# Patient Record
Sex: Male | Born: 1955 | Race: White | Hispanic: No | Marital: Married | Smoking: Never smoker
Health system: Southern US, Community
[De-identification: ages and names within clinical notes are randomized; demographics above are authoritative.]

## PROBLEM LIST (undated history)

## (undated) DIAGNOSIS — J309 Allergic rhinitis, unspecified: Secondary | ICD-10-CM

## (undated) DIAGNOSIS — E7800 Pure hypercholesterolemia, unspecified: Secondary | ICD-10-CM

## (undated) DIAGNOSIS — I10 Essential (primary) hypertension: Secondary | ICD-10-CM

## (undated) DIAGNOSIS — D696 Thrombocytopenia, unspecified: Secondary | ICD-10-CM

## (undated) HISTORY — DX: Allergic rhinitis, unspecified: J30.9

## (undated) HISTORY — DX: Pure hypercholesterolemia, unspecified: E78.00

## (undated) HISTORY — DX: Essential (primary) hypertension: I10

## (undated) HISTORY — DX: Thrombocytopenia, unspecified: D69.6

## (undated) HISTORY — PX: OTHER SURGICAL HISTORY: SHX169

---
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
## 2005-04-10 ENCOUNTER — Ambulatory Visit (HOSPITAL_COMMUNITY): Admission: RE | Admit: 2005-04-10 | Discharge: 2005-04-10 | Payer: Self-pay | Admitting: Gastroenterology

## 2009-08-03 ENCOUNTER — Ambulatory Visit: Payer: Self-pay | Admitting: Oncology

## 2009-08-12 LAB — CBC WITH DIFFERENTIAL/PLATELET
BASO%: 0.3 % (ref 0.0–2.0)
Basophils Absolute: 0 10*3/uL (ref 0.0–0.1)
EOS%: 3.5 % (ref 0.0–7.0)
Eosinophils Absolute: 0.2 10*3/uL (ref 0.0–0.5)
HCT: 43.8 % (ref 38.4–49.9)
HGB: 14.8 g/dL (ref 13.0–17.1)
LYMPH%: 31.2 % (ref 14.0–49.0)
MCH: 31.3 pg (ref 27.2–33.4)
MCHC: 33.9 g/dL (ref 32.0–36.0)
MCV: 92.3 fL (ref 79.3–98.0)
MONO#: 0.4 10*3/uL (ref 0.1–0.9)
MONO%: 7.3 % (ref 0.0–14.0)
NEUT#: 3.1 10*3/uL (ref 1.5–6.5)
NEUT%: 57.7 % (ref 39.0–75.0)
Platelets: 138 10*3/uL — ABNORMAL LOW (ref 140–400)
RBC: 4.74 10*6/uL (ref 4.20–5.82)
RDW: 13.6 % (ref 11.0–14.6)
WBC: 5.4 10*3/uL (ref 4.0–10.3)
lymph#: 1.7 10*3/uL (ref 0.9–3.3)

## 2009-08-12 LAB — COMPREHENSIVE METABOLIC PANEL
ALT: 26 U/L (ref 0–53)
AST: 24 U/L (ref 0–37)
Albumin: 4.1 g/dL (ref 3.5–5.2)
Alkaline Phosphatase: 47 U/L (ref 39–117)
BUN: 18 mg/dL (ref 6–23)
CO2: 31 mEq/L (ref 19–32)
Calcium: 9.3 mg/dL (ref 8.4–10.5)
Chloride: 101 mEq/L (ref 96–112)
Creatinine, Ser: 1.25 mg/dL (ref 0.40–1.50)
Glucose, Bld: 105 mg/dL — ABNORMAL HIGH (ref 70–99)
Potassium: 3.9 mEq/L (ref 3.5–5.3)
Sodium: 139 mEq/L (ref 135–145)
Total Bilirubin: 0.9 mg/dL (ref 0.3–1.2)
Total Protein: 8 g/dL (ref 6.0–8.3)

## 2009-08-12 LAB — CHCC SMEAR

## 2009-08-12 LAB — MORPHOLOGY: PLT EST: DECREASED

## 2009-08-14 LAB — ANA: Anti Nuclear Antibody(ANA): NEGATIVE

## 2009-08-16 LAB — PROTEIN ELECTROPHORESIS, SERUM
Albumin ELP: 57.8 % (ref 55.8–66.1)
Alpha-1-Globulin: 3.6 % (ref 2.9–4.9)
Alpha-2-Globulin: 7.5 % (ref 7.1–11.8)
Beta 2: 5.3 % (ref 3.2–6.5)
Beta Globulin: 6.1 % (ref 4.7–7.2)
Gamma Globulin: 19.7 % — ABNORMAL HIGH (ref 11.1–18.8)
Total Protein, Serum Electrophoresis: 7.4 g/dL (ref 6.0–8.3)

## 2009-08-16 LAB — FOLATE: Folate: >20 ng/mL

## 2009-08-16 LAB — VITAMIN B12: Vitamin B-12: 1073 pg/mL — ABNORMAL HIGH (ref 211–911)

## 2009-08-16 LAB — TSH: TSH: 1.325 u[IU]/mL (ref 0.350–4.500)

## 2009-11-11 ENCOUNTER — Ambulatory Visit: Payer: Self-pay | Admitting: Oncology

## 2009-11-15 LAB — CBC WITH DIFFERENTIAL/PLATELET
BASO%: 0.4 % (ref 0.0–2.0)
Basophils Absolute: 0 10*3/uL (ref 0.0–0.1)
EOS%: 3.9 % (ref 0.0–7.0)
Eosinophils Absolute: 0.3 10*3/uL (ref 0.0–0.5)
HCT: 42.5 % (ref 38.4–49.9)
HGB: 14.6 g/dL (ref 13.0–17.1)
LYMPH%: 25.4 % (ref 14.0–49.0)
MCH: 31.4 pg (ref 27.2–33.4)
MCHC: 34.5 g/dL (ref 32.0–36.0)
MCV: 91.2 fL (ref 79.3–98.0)
MONO#: 0.5 10*3/uL (ref 0.1–0.9)
MONO%: 7.2 % (ref 0.0–14.0)
NEUT#: 4.1 10*3/uL (ref 1.5–6.5)
NEUT%: 63.1 % (ref 39.0–75.0)
Platelets: 158 10*3/uL (ref 140–400)
RBC: 4.66 10*6/uL (ref 4.20–5.82)
RDW: 13.7 % (ref 11.0–14.6)
WBC: 6.4 10*3/uL (ref 4.0–10.3)
lymph#: 1.6 10*3/uL (ref 0.9–3.3)

## 2009-11-15 LAB — MORPHOLOGY: PLT EST: ADEQUATE

## 2009-11-15 LAB — CHCC SMEAR

## 2010-02-11 ENCOUNTER — Ambulatory Visit: Payer: Self-pay | Admitting: Oncology

## 2010-02-14 LAB — CBC WITH DIFFERENTIAL/PLATELET
BASO%: 0.4 % (ref 0.0–2.0)
Basophils Absolute: 0 10*3/uL (ref 0.0–0.1)
EOS%: 3.6 % (ref 0.0–7.0)
Eosinophils Absolute: 0.2 10*3/uL (ref 0.0–0.5)
HCT: 41.5 % (ref 38.4–49.9)
HGB: 14.7 g/dL (ref 13.0–17.1)
LYMPH%: 34.5 % (ref 14.0–49.0)
MCH: 31.7 pg (ref 27.2–33.4)
MCHC: 35.4 g/dL (ref 32.0–36.0)
MCV: 89.6 fL (ref 79.3–98.0)
MONO#: 0.4 10*3/uL (ref 0.1–0.9)
MONO%: 8.1 % (ref 0.0–14.0)
NEUT#: 2.6 10*3/uL (ref 1.5–6.5)
NEUT%: 53.4 % (ref 39.0–75.0)
Platelets: 136 10*3/uL — ABNORMAL LOW (ref 140–400)
RBC: 4.64 10*6/uL (ref 4.20–5.82)
RDW: 13.6 % (ref 11.0–14.6)
WBC: 4.9 10*3/uL (ref 4.0–10.3)
lymph#: 1.7 10*3/uL (ref 0.9–3.3)

## 2010-02-14 LAB — CHCC SMEAR

## 2010-02-14 LAB — COMPREHENSIVE METABOLIC PANEL
ALT: 20 U/L (ref 0–53)
AST: 18 U/L (ref 0–37)
Albumin: 4.4 g/dL (ref 3.5–5.2)
Alkaline Phosphatase: 48 U/L (ref 39–117)
BUN: 18 mg/dL (ref 6–23)
CO2: 25 mEq/L (ref 19–32)
Calcium: 9.1 mg/dL (ref 8.4–10.5)
Chloride: 102 mEq/L (ref 96–112)
Creatinine, Ser: 1.12 mg/dL (ref 0.40–1.50)
Glucose, Bld: 108 mg/dL — ABNORMAL HIGH (ref 70–99)
Potassium: 4.1 mEq/L (ref 3.5–5.3)
Sodium: 138 mEq/L (ref 135–145)
Total Bilirubin: 0.5 mg/dL (ref 0.3–1.2)
Total Protein: 7.5 g/dL (ref 6.0–8.3)

## 2010-02-14 LAB — HEPATITIS C ANTIBODY: HCV Ab: NEGATIVE

## 2010-03-04 ENCOUNTER — Ambulatory Visit (HOSPITAL_COMMUNITY): Admission: RE | Admit: 2010-03-04 | Discharge: 2010-03-04 | Payer: Self-pay | Admitting: Oncology

## 2010-05-12 ENCOUNTER — Ambulatory Visit: Payer: Self-pay | Admitting: Oncology

## 2010-05-16 LAB — CBC WITH DIFFERENTIAL/PLATELET
BASO%: 0.4 % (ref 0.0–2.0)
Basophils Absolute: 0 10*3/uL (ref 0.0–0.1)
EOS%: 3.9 % (ref 0.0–7.0)
Eosinophils Absolute: 0.2 10*3/uL (ref 0.0–0.5)
HCT: 43 % (ref 38.4–49.9)
HGB: 15.1 g/dL (ref 13.0–17.1)
LYMPH%: 29.6 % (ref 14.0–49.0)
MCH: 31.5 pg (ref 27.2–33.4)
MCHC: 35.1 g/dL (ref 32.0–36.0)
MCV: 89.7 fL (ref 79.3–98.0)
MONO#: 0.3 10*3/uL (ref 0.1–0.9)
MONO%: 6.3 % (ref 0.0–14.0)
NEUT#: 3.2 10*3/uL (ref 1.5–6.5)
NEUT%: 59.8 % (ref 39.0–75.0)
Platelets: 141 10*3/uL (ref 140–400)
RBC: 4.8 10*6/uL (ref 4.20–5.82)
RDW: 13.8 % (ref 11.0–14.6)
WBC: 5.4 10*3/uL (ref 4.0–10.3)
lymph#: 1.6 10*3/uL (ref 0.9–3.3)

## 2010-05-17 LAB — HEPATITIS B SURFACE ANTIGEN: Hepatitis B Surface Ag: NEGATIVE

## 2010-05-17 LAB — HEPATITIS B CORE ANTIBODY, IGM: Hep B C IgM: NEGATIVE

## 2010-05-17 LAB — HEPATITIS B CORE ANTIBODY, TOTAL: Hep B Core Total Ab: NEGATIVE

## 2010-05-17 LAB — HEPATITIS B SURFACE ANTIBODY,QUALITATIVE: Hep B S Ab: NEGATIVE

## 2010-08-18 ENCOUNTER — Ambulatory Visit: Payer: Self-pay | Admitting: Oncology

## 2010-08-22 LAB — CBC WITH DIFFERENTIAL/PLATELET
BASO%: 0.5 % (ref 0.0–2.0)
Basophils Absolute: 0 10*3/uL (ref 0.0–0.1)
EOS%: 3.3 % (ref 0.0–7.0)
Eosinophils Absolute: 0.2 10*3/uL (ref 0.0–0.5)
HCT: 41.4 % (ref 38.4–49.9)
HGB: 14.6 g/dL (ref 13.0–17.1)
LYMPH%: 32.9 % (ref 14.0–49.0)
MCH: 31.8 pg (ref 27.2–33.4)
MCHC: 35.3 g/dL (ref 32.0–36.0)
MCV: 90 fL (ref 79.3–98.0)
MONO#: 0.4 10*3/uL (ref 0.1–0.9)
MONO%: 8 % (ref 0.0–14.0)
NEUT#: 2.9 10*3/uL (ref 1.5–6.5)
NEUT%: 55.3 % (ref 39.0–75.0)
Platelets: 144 10*3/uL (ref 140–400)
RBC: 4.6 10*6/uL (ref 4.20–5.82)
RDW: 13.9 % (ref 11.0–14.6)
WBC: 5.2 10*3/uL (ref 4.0–10.3)
lymph#: 1.7 10*3/uL (ref 0.9–3.3)

## 2010-08-22 LAB — COMPREHENSIVE METABOLIC PANEL
ALT: 21 U/L (ref 0–53)
AST: 21 U/L (ref 0–37)
Albumin: 4.5 g/dL (ref 3.5–5.2)
Alkaline Phosphatase: 49 U/L (ref 39–117)
BUN: 19 mg/dL (ref 6–23)
CO2: 29 mEq/L (ref 19–32)
Calcium: 9.3 mg/dL (ref 8.4–10.5)
Chloride: 102 mEq/L (ref 96–112)
Creatinine, Ser: 0.98 mg/dL (ref 0.40–1.50)
Glucose, Bld: 97 mg/dL (ref 70–99)
Potassium: 4 mEq/L (ref 3.5–5.3)
Sodium: 140 mEq/L (ref 135–145)
Total Bilirubin: 0.6 mg/dL (ref 0.3–1.2)
Total Protein: 7.4 g/dL (ref 6.0–8.3)

## 2010-08-22 LAB — LACTATE DEHYDROGENASE: LDH: 126 U/L (ref 94–250)

## 2011-02-17 NOTE — Op Note (Signed)
NAME:  TRISHA, MORANDI                ACCOUNT NO.:  [id number]   MEDICAL RECORD NO.:  [id number]          PATIENT TYPE:  AMB   LOCATION:  ENDO                         FACILITY:  [facility]   PHYSICIAN:  Danise Edge, M.D.   DATE OF BIRTH:  04/26/1956   DATE OF PROCEDURE:  04/10/2005  DATE OF DISCHARGE:                                 OPERATIVE REPORT   PROCEDURE:  Screening colonoscopy.   INDICATIONS:  Mr. Reyaan Thoma is a 55 year old male born [birth year]. Mr.  Yu is scheduled to undergo his first screening colonoscopy with  polypectomy to prevent colon cancer.   ENDOSCOPIST:  Danise Edge, M.D.   PREMEDICATION:  Versed 9 mg, Demerol 90 mg.   DESCRIPTION OF PROCEDURE:  After obtaining informed consent, Mr. Venn was  placed in the left lateral decubitus position. I administered intravenous  Demerol and intravenous Versed to achieve conscious sedation for the  procedure. The patient's blood pressure, oxygen saturation and cardiac  rhythm were monitored throughout the procedure and documented in the medical  record.   Anal inspection and digital rectal exam were normal. The prostate was  nonnodular. The Olympus adjustable pediatric colonoscope was introduced into  the rectum and advanced to the cecum. Mr. Chura has a very long somewhat  tortuous colon. In order to reach the cecum, he was rolled to the supine  position and finally the right lateral decubitus position applying external  abdominal pressure. Colonic preparation for the exam today was excellent.   RECTUM:  Normal. I was unable to perform a retroflexed view of the distal  rectum.  SIGMOID COLON AND DESCENDING COLON:  Normal.  SPLENIC FLEXURE:  Normal.  TRANSVERSE COLON:  Normal.  HEPATIC FLEXURE:  Normal.  ASCENDING COLON:  Normal.  CECUM AND ILEOCECAL VALVE:  Normal.   ASSESSMENT:  Normal screening proctocolonoscopy to the cecum.   RECOMMENDATIONS:  Virtual colonoscopy in five years.       MJ/MEDQ   D:  04/10/2005  T:  04/10/2005  Job:  [id number]

## 2013-10-20 ENCOUNTER — Other Ambulatory Visit: Payer: Self-pay | Admitting: Geriatric Medicine

## 2013-10-20 ENCOUNTER — Ambulatory Visit
Admission: RE | Admit: 2013-10-20 | Discharge: 2013-10-20 | Disposition: A | Discharge status: Home / Self Care | Payer: BC Managed Care – PPO | Source: Ambulatory Visit | Attending: Geriatric Medicine | Admitting: Geriatric Medicine

## 2013-10-20 DIAGNOSIS — R079 Chest pain, unspecified: Secondary | ICD-10-CM

## 2013-11-04 ENCOUNTER — Encounter: Payer: Self-pay | Admitting: Cardiology

## 2013-11-04 DIAGNOSIS — E782 Mixed hyperlipidemia: Secondary | ICD-10-CM | POA: Insufficient documentation

## 2013-11-04 DIAGNOSIS — Z79899 Other long term (current) drug therapy: Secondary | ICD-10-CM

## 2013-11-04 DIAGNOSIS — R079 Chest pain, unspecified: Secondary | ICD-10-CM

## 2013-11-04 DIAGNOSIS — I10 Essential (primary) hypertension: Secondary | ICD-10-CM | POA: Insufficient documentation

## 2013-11-04 DIAGNOSIS — E7800 Pure hypercholesterolemia, unspecified: Secondary | ICD-10-CM | POA: Insufficient documentation

## 2013-11-05 ENCOUNTER — Ambulatory Visit: Payer: Self-pay | Admitting: Interventional Cardiology

## 2013-11-11 ENCOUNTER — Ambulatory Visit (INDEPENDENT_AMBULATORY_CARE_PROVIDER_SITE_OTHER): Payer: BC Managed Care – PPO | Admitting: Nurse Practitioner

## 2013-11-11 ENCOUNTER — Encounter: Payer: Self-pay | Admitting: Interventional Cardiology

## 2013-11-11 ENCOUNTER — Ambulatory Visit (INDEPENDENT_AMBULATORY_CARE_PROVIDER_SITE_OTHER): Payer: BC Managed Care – PPO | Admitting: Interventional Cardiology

## 2013-11-11 VITALS — BP 129/85 | HR 107

## 2013-11-11 VITALS — BP 134/82 | HR 101 | Ht 70.0 in | Wt 210.0 lb

## 2013-11-11 DIAGNOSIS — R079 Chest pain, unspecified: Secondary | ICD-10-CM

## 2013-11-11 NOTE — Progress Notes (Signed)
Patient ID: TZION WEDEL, male   DOB: 11-16-1955, 58 y.o.   MRN: [id number]     Patient ID: ANDREE GOLPHIN MRN: [id number] DOB/AGE: 1956/07/31 58 y.o.   Referring Physician Dr. Felipa Eth   Reason for Consultation chest pain  HPI: 58 y/o with some RF for CAD.  He had some left sided chest pain 6 weeks ago that radiated to his back.  It was not associated with any exertion.  No sweating or nausea.  No dizziness, vomiting or paasing out occurred.  Most strenuous is walking several flights of stairs in a hotel.  He does not do any other regular exercise.   His father had an MI at 83.  The patient has had a stress test several years ago. It was normal per his report.  He has had routine ECGs.  Two sisters without heart problems.     Current Outpatient Prescriptions  Medication Sig Dispense Refill  . aspirin 81 MG tablet Take 81 mg by mouth daily.      Marland Kitchen lisinopril (PRINIVIL,ZESTRIL) 10 MG tablet Take 10 mg by mouth daily.      Marland Kitchen loratadine (CLARITIN) 10 MG tablet Take 10 mg by mouth daily.      Marland Kitchen zolpidem (AMBIEN) 10 MG tablet Take 10 mg by mouth at bedtime as needed for sleep.       No current facility-administered medications for this visit.   Past Medical History  Diagnosis Date  . Hypertension   . Allergic rhinitis   . Hypercholesteremia     goal LDL less than 130- 10 year risk 8.8 on 7/14  . Hiatal hernia   . Thrombocytopenia     130,000 in 2010-Dr. HA-due to simvastatin    Family History  Problem Relation Age of Onset  . Arrhythmia Mother     afib  . Hypertension Mother   . Other Mother     died in [place] with husband  . Alzheimer's disease Father   . Heart disease Father   . Other Father     died in MVA    History   Social History  . Marital Status: Married    Spouse Name: N/A    Number of Children: N/A  . Years of Education: N/A   Occupational History  . Not on file.   Social History Main Topics  . Smoking status: Never Smoker   . Smokeless tobacco: Not  on file  . Alcohol Use: Yes  . Drug Use: No  . Sexual Activity: Not on file   Other Topics Concern  . Not on file   Social History Narrative  . No narrative on file    Past Surgical History  Procedure Laterality Date  . Appendectomy  1975  . Ruptured disc        (Not in a hospital admission)  Review of systems complete and found to be negative unless listed above .  No nausea, vomiting.  No fever chills, No focal weakness,  No palpitations.  Physical Exam: Filed Vitals:   11/11/13 0901  BP: 134/82  Pulse: 101    Weight: 210 lb (95.255 kg)  Physical exam:  [place]/AT EOMI No JVD, No carotid bruit RRR S1S2  No wheezing Soft. NT, nondistended No edema. No focal motor or sensory deficits Normal affect  Labs:   Lab Results  Component Value Date   WBC 5.2 08/22/2010   HGB 14.6 08/22/2010   HCT 41.4 08/22/2010   MCV 90.0 08/22/2010   PLT  144 08/22/2010   No results found for this basename: NA, K, CL, CO2, BUN, CREATININE, CALCIUM, LABALBU, PROT, BILITOT, ALKPHOS, ALT, AST, GLUCOSE,  in the last 168 hours No results found for this basename: CKTOTAL, CKMB, CKMBINDEX, TROPONINI    No results found for this basename: CHOL   No results found for this basename: HDL   No results found for this basename: LDLCALC   No results found for this basename: TRIG   No results found for this basename: CHOLHDL   No results found for this basename: LDLDIRECT      Radiology: Normal CXray in Jan 2015 EKG: Sinus tachycardia, NSST inferiorly  ASSESSMENT AND PLAN:  1. Atypical chest pain: Plan for ETT.  Doubt ischemia based on sx.  Overall, he needs RF modification.  I would recommend that he walk 30 minutes a day 5 days/week.  He does travel a fair amount.  He needs to use the gym at the hotels.  He needs to make good choices when eating out while away.  Cholesterol: 200; TG 117; HDL 33; LDL143.  Hopefully, LDL to < 130 after lifestyle changes.  Signed:   Mina Marble, MD,  [facility] 11/11/2013, 9:28 AM

## 2013-11-11 NOTE — Patient Instructions (Signed)
Your physician has requested that you have an exercise tolerance test. For further information please visit [email or web address]. Please also follow instruction sheet, as given.  Your physician recommends that you schedule a follow-up appointment as needed.   

## 2013-11-11 NOTE — Progress Notes (Signed)
Exercise Treadmill Test  Pre-Exercise Testing Evaluation Rhythm: sinus tachycardia  Rate: 96 bpm     Test  Exercise Tolerance Test Ordering MD: Casandra Doffing, MD  Interpreting MD: Truitt Merle, NP  Unique Test No: 1  Treadmill:  1  Indication for ETT: chest pain - rule out ischemia  Contraindication to ETT: No   Stress Modality: exercise - treadmill  Cardiac Imaging Performed: non   Protocol: standard Bruce - maximal  Max BP:  202/71*  Max MPHR (bpm):  162 85% MPR (bpm):  138  MPHR obtained (bpm):  148 % MPHR obtained:  91%  Reached 85% MPHR (min:sec):  7:35 Total Exercise Time (min-sec):  10 minutes  Workload in METS:  11.7 Borg Scale: 15  Reason ETT Terminated:  patient's desire to stop    ST Segment Analysis At Rest: normal ST segments - no evidence of significant ST depression With Exercise: no evidence of significant ST depression  Other Information Arrhythmia:  No Angina during ETT:  absent (0) Quality of ETT:  diagnostic  ETT Interpretation:  normal - no evidence of ischemia by ST analysis  Comments: Patient presents today for routine GXT. Has had atypical chest pain and has risk factors for CAD.   Today the patient exercised on the standard Bruce protocol for a total of 10 minutes.  Good exercise tolerance.  Adequate blood pressure response.  Clinically negative for chest pain. Test was stopped due to achievement of target HR/fatigue.  EKG negative for ischemia. No significant arrhythmia noted.   Recommendations: CV risk factor modification.  See back prn.  Patient is agreeable to this plan and will call if any problems develop in the interim.   Burtis Junes, RN, [place] [address] [place], [place]  [address] [phone]

## 2013-12-23 ENCOUNTER — Ambulatory Visit (HOSPITAL_COMMUNITY)
Admission: RE | Admit: 2013-12-23 | Discharge: 2013-12-23 | Disposition: A | Discharge status: Home / Self Care | Payer: BC Managed Care – PPO | Source: Ambulatory Visit | Attending: Orthopedic Surgery | Admitting: Orthopedic Surgery

## 2013-12-23 ENCOUNTER — Other Ambulatory Visit (HOSPITAL_COMMUNITY): Payer: Self-pay | Admitting: Orthopedic Surgery

## 2013-12-23 DIAGNOSIS — Z135 Encounter for screening for eye and ear disorders: Secondary | ICD-10-CM | POA: Insufficient documentation

## 2013-12-23 DIAGNOSIS — R52 Pain, unspecified: Secondary | ICD-10-CM

## 2015-07-08 ENCOUNTER — Other Ambulatory Visit: Payer: Self-pay | Admitting: Gastroenterology

## 2015-10-07 ENCOUNTER — Encounter (HOSPITAL_COMMUNITY): Payer: Self-pay | Admitting: *Deleted

## 2015-10-18 ENCOUNTER — Ambulatory Visit (HOSPITAL_COMMUNITY): Payer: BLUE CROSS/BLUE SHIELD | Admitting: Anesthesiology

## 2015-10-18 ENCOUNTER — Encounter (HOSPITAL_COMMUNITY): Payer: Self-pay

## 2015-10-18 ENCOUNTER — Encounter (HOSPITAL_COMMUNITY)
Admission: RE | Disposition: A | Discharge status: Home / Self Care | Payer: Self-pay | Source: Ambulatory Visit | Attending: Gastroenterology

## 2015-10-18 ENCOUNTER — Ambulatory Visit (HOSPITAL_COMMUNITY)
Admission: RE | Admit: 2015-10-18 | Discharge: 2015-10-18 | Disposition: A | Discharge status: Home / Self Care | Payer: BLUE CROSS/BLUE SHIELD | Source: Ambulatory Visit | Attending: Gastroenterology | Admitting: Gastroenterology

## 2015-10-18 DIAGNOSIS — Z6830 Body mass index (BMI) 30.0-30.9, adult: Secondary | ICD-10-CM | POA: Diagnosis not present

## 2015-10-18 DIAGNOSIS — I10 Essential (primary) hypertension: Secondary | ICD-10-CM | POA: Diagnosis not present

## 2015-10-18 DIAGNOSIS — Z1211 Encounter for screening for malignant neoplasm of colon: Secondary | ICD-10-CM | POA: Insufficient documentation

## 2015-10-18 DIAGNOSIS — Z79899 Other long term (current) drug therapy: Secondary | ICD-10-CM | POA: Diagnosis not present

## 2015-10-18 DIAGNOSIS — Z9049 Acquired absence of other specified parts of digestive tract: Secondary | ICD-10-CM | POA: Insufficient documentation

## 2015-10-18 DIAGNOSIS — D120 Benign neoplasm of cecum: Secondary | ICD-10-CM | POA: Diagnosis not present

## 2015-10-18 DIAGNOSIS — E7800 Pure hypercholesterolemia, unspecified: Secondary | ICD-10-CM | POA: Diagnosis not present

## 2015-10-18 DIAGNOSIS — E669 Obesity, unspecified: Secondary | ICD-10-CM | POA: Insufficient documentation

## 2015-10-18 HISTORY — PX: COLONOSCOPY WITH PROPOFOL: SHX5780

## 2015-10-18 SURGERY — COLONOSCOPY WITH PROPOFOL
Anesthesia: Monitor Anesthesia Care

## 2015-10-18 MED ORDER — PROPOFOL 10 MG/ML IV BOLUS
INTRAVENOUS | Status: DC | PRN
  Administered 2015-10-18: 50 mg via INTRAVENOUS
  Administered 2015-10-18 (×2): 20 mg via INTRAVENOUS
  Administered 2015-10-18: 30 mg via INTRAVENOUS
  Administered 2015-10-18 (×2): 20 mg via INTRAVENOUS
  Administered 2015-10-18: 30 mg via INTRAVENOUS
  Administered 2015-10-18: 10 mg via INTRAVENOUS
  Administered 2015-10-18: 20 mg via INTRAVENOUS
  Administered 2015-10-18: 10 mg via INTRAVENOUS
  Administered 2015-10-18 (×2): 30 mg via INTRAVENOUS
  Administered 2015-10-18: 20 mg via INTRAVENOUS
  Administered 2015-10-18: 40 mg via INTRAVENOUS
  Administered 2015-10-18: 20 mg via INTRAVENOUS
  Administered 2015-10-18: 30 mg via INTRAVENOUS

## 2015-10-18 MED ORDER — SODIUM CHLORIDE 0.9 % IV SOLN: INTRAVENOUS | Status: DC

## 2015-10-18 MED ORDER — PROPOFOL 10 MG/ML IV BOLUS
INTRAVENOUS | Status: AC
  Filled 2015-10-18: qty 40

## 2015-10-18 MED ORDER — LIDOCAINE HCL (CARDIAC) 20 MG/ML IV SOLN
INTRAVENOUS | Status: AC
  Filled 2015-10-18: qty 5

## 2015-10-18 MED ORDER — LIDOCAINE HCL (CARDIAC) 20 MG/ML IV SOLN
INTRAVENOUS | Status: DC | PRN
  Administered 2015-10-18: 50 mg via INTRAVENOUS

## 2015-10-18 MED ORDER — LACTATED RINGERS IV SOLN
INTRAVENOUS | Status: DC
  Administered 2015-10-18: 10:00:00 via INTRAVENOUS

## 2015-10-18 SURGICAL SUPPLY — 21 items

## 2015-10-18 NOTE — H&P (Signed)
  Procedure: Screening colonoscopy. Normal screening colonoscopy performed on 04/10/2005  History: The patient is a 60 year old male born 02-11-1956. He is scheduled to undergo a repeat screening colonoscopy today.  Past medical history: Hypertension. Allergic rhinitis. Hypercholesterolemia. Thrombocytopenia due to simvastatin. Appendectomy. Ruptured disc surgery.  Exam: The patient is alert and lying comfortably on the endoscopy stretcher. Abdomen is soft and nontender to palpation. Lungs are clear to auscultation. Cardiac exam reveals a regular rhythm.  Plan: Proceed with screening colonoscopy

## 2015-10-18 NOTE — Anesthesia Postprocedure Evaluation (Signed)
Anesthesia Post Note  Patient: Nicholas Valentine  Procedure(s) Performed: Procedure(s) (LRB): COLONOSCOPY WITH PROPOFOL (N/A)  Patient location during evaluation: PACU Anesthesia Type: MAC Level of consciousness: awake and alert Pain management: pain level controlled Vital Signs Assessment: post-procedure vital signs reviewed and stable Respiratory status: spontaneous breathing, nonlabored ventilation, respiratory function stable and patient connected to nasal cannula oxygen Cardiovascular status: blood pressure returned to baseline and stable Postop Assessment: no signs of nausea or vomiting Anesthetic complications: no    Last Vitals:  Filed Vitals:   10/18/15 1200 10/18/15 1210  BP: 142/88 141/89  Pulse: 76 69  Temp:    Resp: 14 17    Last Pain: There were no vitals filed for this visit.               Javonne Louissaint JENNETTE

## 2015-10-18 NOTE — Transfer of Care (Signed)
Immediate Anesthesia Transfer of Care Note  Patient: Nicholas Valentine  Procedure(s) Performed: Procedure(s): COLONOSCOPY WITH PROPOFOL (N/A)  Patient Location: PACU  Anesthesia Type:MAC  Level of Consciousness: Patient easily awoken, sedated, comfortable, cooperative, following commands, responds to stimulation.   Airway & Oxygen Therapy: Patient spontaneously breathing, ventilating well, oxygen via simple oxygen mask.  Post-op Assessment: Report given to PACU RN, vital signs reviewed and stable, moving all extremities.   Post vital signs: Reviewed and stable.  Complications: No apparent anesthesia complications

## 2015-10-18 NOTE — Op Note (Signed)
Procedure: Screening colonoscopy. Normal screening colonoscopy performed on 04/10/2005  Endoscopist: Earle Gell  Premedication: Propofol administered by anesthesia  Procedure: The patient was placed in the left lateral decubitus position. Anal inspection and digital rectal exam were normal. The Pentax pediatric colonoscope was introduced into the rectum and advanced to the cecum. A normal-appearing ileocecal valve and appendiceal orifice were identified. Colonic preparation for the exam today was good. Withdrawal time was 11 minutes  Rectum. Normal. Retroflexed view of the distal rectum was normal  Sigmoid colon and descending colon. Normal  Splenic flexure. Normal.  Transverse colon. Normal  Hepatic flexure. Normal.  Ascending colon. Normal  Cecum and ileocecal valve. A 3 mm sessile polyp was removed from the proximal cecum with the cold biopsy forceps  Assessment: A diminutive polyp was removed from the proximal cecum. Otherwise normal colonoscopy  Recommendation: If the polyp returns adenomatous pathologically, schedule repeat screening colonoscopy in 5 years.

## 2015-10-18 NOTE — Anesthesia Preprocedure Evaluation (Signed)
Anesthesia Evaluation  Patient identified by MRN, date of birth, ID band Patient awake    Reviewed: Allergy & Precautions, NPO status , Patient's Chart, lab work & pertinent test results  History of Anesthesia Complications Negative for: history of anesthetic complications  Airway Mallampati: II  TM Distance: >3 FB Neck ROM: Full    Dental no notable dental hx. (+) Dental Advisory Given   Pulmonary neg pulmonary ROS,    Pulmonary exam normal breath sounds clear to auscultation       Cardiovascular hypertension, Pt. on medications Normal cardiovascular exam Rhythm:Regular Rate:Normal     Neuro/Psych negative neurological ROS  negative psych ROS   GI/Hepatic negative GI ROS, Neg liver ROS,   Endo/Other  obesity  Renal/GU negative Renal ROS  negative genitourinary   Musculoskeletal negative musculoskeletal ROS (+)   Abdominal   Peds negative pediatric ROS (+)  Hematology negative hematology ROS (+)   Anesthesia Other Findings   Reproductive/Obstetrics negative OB ROS                             Anesthesia Physical Anesthesia Plan  ASA: II  Anesthesia Plan: MAC   Post-op Pain Management:    Induction: Intravenous  Airway Management Planned: Nasal Cannula  Additional Equipment:   Intra-op Plan:   Post-operative Plan:   Informed Consent: I have reviewed the patients History and Physical, chart, labs and discussed the procedure including the risks, benefits and alternatives for the proposed anesthesia with the patient or authorized representative who has indicated his/her understanding and acceptance.   Dental advisory given  Plan Discussed with: CRNA  Anesthesia Plan Comments:         Anesthesia Quick Evaluation

## 2015-10-18 NOTE — Discharge Instructions (Signed)
Colonoscopy, Care After °Refer to this sheet in the next few weeks. These instructions provide you with information on caring for yourself after your procedure. Your health care provider may also give you more specific instructions. Your treatment has been planned according to current medical practices, but problems sometimes occur. Call your health care provider if you have any problems or questions after your procedure. °WHAT TO EXPECT AFTER THE PROCEDURE  °After your procedure, it is typical to have the following: °· A small amount of blood in your stool. °· Moderate amounts of gas and mild abdominal cramping or bloating. °HOME CARE INSTRUCTIONS °· Do not drive, operate machinery, or sign important documents for 24 hours. °· You may shower and resume your regular physical activities, but move at a slower pace for the first 24 hours. °· Take frequent rest periods for the first 24 hours. °· Walk around or put a warm pack on your abdomen to help reduce abdominal cramping and bloating. °· Drink enough fluids to keep your urine clear or pale yellow. °· You may resume your normal diet as instructed by your health care provider. Avoid heavy or fried foods that are hard to digest. °· Avoid drinking alcohol for 24 hours or as instructed by your health care provider. °· Only take over-the-counter or prescription medicines as directed by your health care provider. °· If a tissue sample (biopsy) was taken during your procedure: °¨ Do not take aspirin or blood thinners for 7 days, or as instructed by your health care provider. °¨ Do not drink alcohol for 7 days, or as instructed by your health care provider. °¨ Eat soft foods for the first 24 hours. °SEEK MEDICAL CARE IF: °You have persistent spotting of blood in your stool 2-3 days after the procedure. °SEEK IMMEDIATE MEDICAL CARE IF: °· You have more than a small spotting of blood in your stool. °· You pass large blood clots in your stool. °· Your abdomen is swollen  (distended). °· You have nausea or vomiting. °· You have a fever. °· You have increasing abdominal pain that is not relieved with medicine. °  °This information is not intended to replace advice given to you by your health care provider. Make sure you discuss any questions you have with your health care provider. °  °Document Released: 05/02/2004 Document Revised: 07/09/2013 Document Reviewed: 05/26/2013 °Elsevier Interactive Patient Education ©2016 [employer] Inc. ° °

## 2015-10-19 ENCOUNTER — Encounter (HOSPITAL_COMMUNITY): Payer: Self-pay | Admitting: Gastroenterology

## 2016-04-14 DIAGNOSIS — H5213 Myopia, bilateral: Secondary | ICD-10-CM | POA: Diagnosis not present

## 2016-06-12 DIAGNOSIS — Z79899 Other long term (current) drug therapy: Secondary | ICD-10-CM | POA: Diagnosis not present

## 2016-06-12 DIAGNOSIS — E7800 Pure hypercholesterolemia, unspecified: Secondary | ICD-10-CM | POA: Diagnosis not present

## 2016-06-12 DIAGNOSIS — I10 Essential (primary) hypertension: Secondary | ICD-10-CM | POA: Diagnosis not present

## 2016-06-12 DIAGNOSIS — Z23 Encounter for immunization: Secondary | ICD-10-CM | POA: Diagnosis not present

## 2016-06-12 DIAGNOSIS — Z0000 Encounter for general adult medical examination without abnormal findings: Secondary | ICD-10-CM | POA: Diagnosis not present

## 2016-10-31 DIAGNOSIS — S83419A Sprain of medial collateral ligament of unspecified knee, initial encounter: Secondary | ICD-10-CM | POA: Diagnosis not present

## 2016-12-11 DIAGNOSIS — E669 Obesity, unspecified: Secondary | ICD-10-CM | POA: Diagnosis not present

## 2016-12-11 DIAGNOSIS — Z79899 Other long term (current) drug therapy: Secondary | ICD-10-CM | POA: Diagnosis not present

## 2016-12-11 DIAGNOSIS — I10 Essential (primary) hypertension: Secondary | ICD-10-CM | POA: Diagnosis not present

## 2017-01-26 DIAGNOSIS — H43812 Vitreous degeneration, left eye: Secondary | ICD-10-CM | POA: Diagnosis not present

## 2017-05-14 DIAGNOSIS — S00412A Abrasion of left ear, initial encounter: Secondary | ICD-10-CM | POA: Diagnosis not present

## 2017-07-09 DIAGNOSIS — D696 Thrombocytopenia, unspecified: Secondary | ICD-10-CM | POA: Diagnosis not present

## 2017-07-09 DIAGNOSIS — Z125 Encounter for screening for malignant neoplasm of prostate: Secondary | ICD-10-CM | POA: Diagnosis not present

## 2017-07-09 DIAGNOSIS — Z79899 Other long term (current) drug therapy: Secondary | ICD-10-CM | POA: Diagnosis not present

## 2017-07-09 DIAGNOSIS — Z23 Encounter for immunization: Secondary | ICD-10-CM | POA: Diagnosis not present

## 2017-07-09 DIAGNOSIS — E7800 Pure hypercholesterolemia, unspecified: Secondary | ICD-10-CM | POA: Diagnosis not present

## 2017-07-09 DIAGNOSIS — Z0000 Encounter for general adult medical examination without abnormal findings: Secondary | ICD-10-CM | POA: Diagnosis not present

## 2018-01-07 DIAGNOSIS — I10 Essential (primary) hypertension: Secondary | ICD-10-CM | POA: Diagnosis not present

## 2018-01-07 DIAGNOSIS — Z79899 Other long term (current) drug therapy: Secondary | ICD-10-CM | POA: Diagnosis not present

## 2018-01-07 DIAGNOSIS — D696 Thrombocytopenia, unspecified: Secondary | ICD-10-CM | POA: Diagnosis not present

## 2018-05-03 DIAGNOSIS — H52203 Unspecified astigmatism, bilateral: Secondary | ICD-10-CM | POA: Diagnosis not present

## 2018-07-22 DIAGNOSIS — Z23 Encounter for immunization: Secondary | ICD-10-CM | POA: Diagnosis not present

## 2018-07-22 DIAGNOSIS — I10 Essential (primary) hypertension: Secondary | ICD-10-CM | POA: Diagnosis not present

## 2018-07-22 DIAGNOSIS — Z125 Encounter for screening for malignant neoplasm of prostate: Secondary | ICD-10-CM | POA: Diagnosis not present

## 2018-07-22 DIAGNOSIS — Z79899 Other long term (current) drug therapy: Secondary | ICD-10-CM | POA: Diagnosis not present

## 2018-07-22 DIAGNOSIS — Z0000 Encounter for general adult medical examination without abnormal findings: Secondary | ICD-10-CM | POA: Diagnosis not present

## 2018-07-22 DIAGNOSIS — E7800 Pure hypercholesterolemia, unspecified: Secondary | ICD-10-CM | POA: Diagnosis not present

## 2018-09-16 DIAGNOSIS — E7800 Pure hypercholesterolemia, unspecified: Secondary | ICD-10-CM | POA: Diagnosis not present

## 2018-09-16 DIAGNOSIS — Z79899 Other long term (current) drug therapy: Secondary | ICD-10-CM | POA: Diagnosis not present

## 2019-05-09 DIAGNOSIS — H5203 Hypermetropia, bilateral: Secondary | ICD-10-CM | POA: Diagnosis not present

## 2019-07-23 DIAGNOSIS — R05 Cough: Secondary | ICD-10-CM | POA: Diagnosis not present

## 2019-08-11 DIAGNOSIS — E7800 Pure hypercholesterolemia, unspecified: Secondary | ICD-10-CM | POA: Diagnosis not present

## 2019-08-11 DIAGNOSIS — Z125 Encounter for screening for malignant neoplasm of prostate: Secondary | ICD-10-CM | POA: Diagnosis not present

## 2019-08-11 DIAGNOSIS — Z0000 Encounter for general adult medical examination without abnormal findings: Secondary | ICD-10-CM | POA: Diagnosis not present

## 2019-08-11 DIAGNOSIS — Z79899 Other long term (current) drug therapy: Secondary | ICD-10-CM | POA: Diagnosis not present

## 2019-08-11 DIAGNOSIS — I10 Essential (primary) hypertension: Secondary | ICD-10-CM | POA: Diagnosis not present

## 2019-10-07 DIAGNOSIS — R0981 Nasal congestion: Secondary | ICD-10-CM | POA: Diagnosis not present

## 2019-10-14 DIAGNOSIS — I10 Essential (primary) hypertension: Secondary | ICD-10-CM | POA: Diagnosis not present

## 2019-10-14 DIAGNOSIS — E7800 Pure hypercholesterolemia, unspecified: Secondary | ICD-10-CM | POA: Diagnosis not present

## 2020-01-16 DIAGNOSIS — M7061 Trochanteric bursitis, right hip: Secondary | ICD-10-CM | POA: Diagnosis not present

## 2020-01-16 DIAGNOSIS — I10 Essential (primary) hypertension: Secondary | ICD-10-CM | POA: Diagnosis not present

## 2020-03-10 ENCOUNTER — Ambulatory Visit (INDEPENDENT_AMBULATORY_CARE_PROVIDER_SITE_OTHER): Payer: BC Managed Care – PPO | Admitting: Family Medicine

## 2020-03-10 ENCOUNTER — Encounter: Payer: Self-pay | Admitting: Family Medicine

## 2020-03-10 ENCOUNTER — Other Ambulatory Visit: Payer: Self-pay

## 2020-03-10 VITALS — BP 160/84 | Ht 70.0 in | Wt 210.0 lb

## 2020-03-10 DIAGNOSIS — M25551 Pain in right hip: Secondary | ICD-10-CM | POA: Diagnosis not present

## 2020-03-10 NOTE — Progress Notes (Signed)
PCP: Lajean Manes, MD  Subjective:   HPI: Patient is a 64 y.o. male here for right hip/leg pain.  Patient reports for about 3-4 weeks he's had off and on pain lateral right hip with radiation down right leg at times past the knee into the foot. No associated numbness or tingling. Worse at times in the morning. Pain had resolved for a full 1.5 weeks but then returned. Has tried biofreeze, ice/heat, aspercreme without much benefit. Feels when he turns in bed at times also. No coexistent back pain though he does have back pain from time to time.  Past Medical History:  Diagnosis Date  . Allergic rhinitis   . Hypercholesteremia    goal LDL less than 130- 10 year risk 8.8 on 7/14  . Hypertension   . Thrombocytopenia ([place])    130,000 in 2010-Dr. HA-due to simvastatin    Current Outpatient Medications on File Prior to Visit  Medication Sig Dispense Refill  . aspirin 81 MG tablet Take 81 mg by mouth daily.    . carboxymethylcellulose (REFRESH PLUS) 0.5 % SOLN 2 drops 2 (two) times daily as needed (dry eyes).    Marland Kitchen lisinopril (PRINIVIL,ZESTRIL) 10 MG tablet Take 10 mg by mouth daily.    Marland Kitchen loratadine (CLARITIN) 10 MG tablet Take 10 mg by mouth daily.    . Multiple Vitamins-Minerals (MULTIVITAMIN WITH MINERALS) tablet Take 1 tablet by mouth daily.    . Omega-3 Fatty Acids (FISH OIL) 1000 MG CAPS Take 1 capsule by mouth daily.    . polyethylene glycol-electrolytes (NULYTELY/GOLYTELY) 420 g solution Take 4,000 mLs by mouth as directed.   0  . zolpidem (AMBIEN) 10 MG tablet Take 10 mg by mouth at bedtime as needed for sleep (only when traveling).      No current facility-administered medications on file prior to visit.    Past Surgical History:  Procedure Laterality Date  . APPENDECTOMY  1975  . COLONOSCOPY WITH PROPOFOL N/A 10/18/2015   Procedure: COLONOSCOPY WITH PROPOFOL;  Surgeon: Garlan Fair, MD;  Location: [facility];  Service: Endoscopy;  Laterality: N/A;  . ruptured disc     . vascetomy      Allergies  Allergen Reactions  . Simvastatin Other (See Comments)    Thrombocytopenia (baseline Platelets ~ 140k)    Social History   Socioeconomic History  . Marital status: Married    Spouse name: Not on file  . Number of children: Not on file  . Years of education: Not on file  . Highest education level: Not on file  Occupational History  . Not on file  Tobacco Use  . Smoking status: Never Smoker  Substance and Sexual Activity  . Alcohol use: Yes  . Drug use: No  . Sexual activity: Not on file  Other Topics Concern  . Not on file  Social History Narrative  . Not on file   Social Determinants of Health   Financial Resource Strain:   . Difficulty of Paying Living Expenses:   Food Insecurity:   . Worried About Charity fundraiser in the Last Year:   . Arboriculturist in the Last Year:   Transportation Needs:   . Film/video editor (Medical):   Marland Kitchen Lack of Transportation (Non-Medical):   Physical Activity:   . Days of Exercise per Week:   . Minutes of Exercise per Session:   Stress:   . Feeling of Stress :   Social Connections:   . Frequency of Communication  with Friends and Family:   . Frequency of Social Gatherings with Friends and Family:   . Attends Religious Services:   . Active Member of Clubs or Organizations:   . Attends Archivist Meetings:   Marland Kitchen Marital Status:   Intimate Partner Violence:   . Fear of Current or Ex-Partner:   . Emotionally Abused:   Marland Kitchen Physically Abused:   . Sexually Abused:     Family History  Problem Relation Age of Onset  . Arrhythmia Mother        afib  . Hypertension Mother   . Other Mother        died in [place] with husband  . Alzheimer's disease Father   . Heart disease Father   . Other Father        died in MVA    BP (!) 160/84   Ht 5\' 10"  (1.778 m)   Wt 210 lb (95.3 kg)   BMI 30.13 kg/m   Review of Systems: See HPI above.     Objective:  Physical Exam:  Gen: NAD,  comfortable in exam room  Back: No gross deformity, scoliosis. No paraspinal TTP .  No midline or bony TTP. FROM without pain. Strength LEs 5/5 all muscle groups.   Trace MSRs in patellar and 1+ achilles tendons, equal bilaterally. Negative SLRs. Sensation intact to light touch bilaterally.  Right hip: No deformity. FROM with 5/5 strength. No tenderness to palpation. NVI distally. Negative logroll bilateral hips Negative fabers and piriformis stretches.   Assessment & Plan:  1. Right hip pain - exam reassuring.  Location of pain is posterior to greater trochanter consistent with external rotator strain/tendinopathy, sciatica.  Home exercises and stretches reviewed.  Ice or heat.  Aleve.  Consider formal physical therapy.  F/u in 6 weeks or as needed.

## 2020-03-10 NOTE — Patient Instructions (Signed)
Your pain is due to tendinopathy/strain of external rotators of your hip with some irritation of the sciatic nerve. Start home exercises and stretches as directed every day. Ice or heat 15 minutes at a time 3-4 times a day. Aleve 1-2 tabs twice a day as needed. Consider formal physical therapy if not improving. No restrictions on activities though you may notice this more if you work on irregular surface, walk on hills or do a lot of stairs. Follow up with me in 6 weeks or as needed if you're doing well.

## 2020-04-23 ENCOUNTER — Ambulatory Visit: Payer: BC Managed Care – PPO | Admitting: Family Medicine

## 2020-04-23 ENCOUNTER — Encounter: Payer: Self-pay | Admitting: Family Medicine

## 2020-04-23 ENCOUNTER — Other Ambulatory Visit: Payer: Self-pay

## 2020-04-23 DIAGNOSIS — M5410 Radiculopathy, site unspecified: Secondary | ICD-10-CM | POA: Insufficient documentation

## 2020-04-23 MED ORDER — PREDNISONE 10 MG PO TABS: ORAL_TABLET | ORAL | 0 refills | Status: DC

## 2020-04-23 NOTE — Patient Instructions (Signed)
Thank you for coming in to see [place] today! We believe the pain in your left leg is actually radiating pain from you low back. Please see below to review our plan for today's visit:  1. Take Prednisone daily as prescribed for the next 6 days. This can help to calm down inflammation in the nerve. 2. Please follow up in 1 month 3. Consider Physical Therapy to strengthen back/core to support the spine more and decrease pain.   Please call the clinic at [phone] if your symptoms worsen or you have any concerns. It was our pleasure to serve you!   Dr. Karlton Lemon Dr. Milus Banister [facility]

## 2020-04-23 NOTE — Progress Notes (Signed)
  Nicholas Valentine - 64 y.o. male MRN [id number]  Date of birth: Aug 08, 1956    SUBJECTIVE:    Pleasant patient presenting to clinic today for follow up of R leg pain.   Chief Complaint:/ HPI:  Office visit 03/10/20: Reported 3-4 weeks of lateral R hip pain with radiation down R leg often times past knee into foot, worse in AM, tried biofreeze, ice/heat, aspercreme w/o much relief. Pain was thought to be due to tendinopathy/strain of external rotators of hip with some irritation of the sciatic nerve. Was instructed to start home exercises/stretches, take Aleve, consider PT and to follow up in 6 weeks.   Today (04/23/2020) the patient reports a shift in his pain to the area around the greater trochanter. Reports throbbing pain occurring 3-4 times daily that originates from lateral R hip to front of R thigh and to R shin. Pain will last 30-45 minutes and usually self resolves. Has not found any particular triggers for the pain or meds/treatment that make the pain go away faster. He has been using Aspercreme, Biofreeze, Cold/Hot compresses, and Advil to relieve pain without much success. Denies any lower extremity numbness, tingling, bladder or bowel incontinence, or focal neurological deficits.   ROS:     See HPI  PERTINENT  PMH / PSH FH / / SH:  Past Medical, Surgical, Social, and Family History Reviewed & Updated in the EMR.  Pertinent findings include:  Patient Active Problem List   Diagnosis Date Noted  . Radiculopathy of leg 04/23/2020  . Essential hypertension, benign 11/04/2013  . Encounter for long-term (current) use of other medications 11/04/2013  . Chest pain, unspecified 11/04/2013  . Pure hypercholesterolemia 11/04/2013  . Mixed hyperlipidemia 11/04/2013     OBJECTIVE: BP (!) 160/98   Ht 5\' 10"  (1.778 m)   Wt (!) 215 lb (97.5 kg)   BMI 30.85 kg/m   Physical Exam:  Vital signs are reviewed.  GEN: Alert and oriented, NAD Pulm: Breathing unlabored PSY: normal mood, congruent  affect  MSK: Right Hip:  - Inspection: No gross deformity, no swelling, erythema, or ecchymosis - Palpation: No TTP, specifically none over greater trochanter - ROM: Normal range of motion on Flexion, extension, abduction, internal and external rotation - Strength: Normal strength. - Neuro/vasc: NV intact distally - Special Tests: Negative FABER and FADIR, logroll.  Back: No gross deformity, scoliosis. No paraspinal TTP .  No midline or bony TTP. FROM. Strength LEs 5/5 all muscle groups.   Negative SLRs. Sensation intact to light touch bilaterally.  ASSESSMENT & PLAN:  1. Radiculopathy of R leg: no PE findings to suggest the hip or a trochanter bursitis as cause of pain. Pain is in distribution of L4, L5. -Take Prednisone daily as prescribed for the next 6 days -Follow up in 1 month -Consider Physical Therapy to strengthen back/core to support the spine more and decrease pain.   Milus Banister, [place], PGY-3 04/23/2020 12:23 PM

## 2020-04-29 MED ORDER — PREDNISONE 10 MG PO TABS: ORAL_TABLET | ORAL | 0 refills | Status: AC

## 2020-05-05 NOTE — Telephone Encounter (Signed)
Please go ahead with Lumbar spine MRI for him - assess for lumbar disc herniation.  Thanks!

## 2020-05-06 ENCOUNTER — Other Ambulatory Visit: Payer: Self-pay

## 2020-05-06 DIAGNOSIS — M5410 Radiculopathy, site unspecified: Secondary | ICD-10-CM

## 2020-05-11 ENCOUNTER — Ambulatory Visit
Admission: RE | Admit: 2020-05-11 | Discharge: 2020-05-11 | Disposition: A | Discharge status: Home / Self Care | Payer: BC Managed Care – PPO | Source: Ambulatory Visit | Attending: Family Medicine | Admitting: Family Medicine

## 2020-05-11 ENCOUNTER — Other Ambulatory Visit: Payer: Self-pay

## 2020-05-11 DIAGNOSIS — M48061 Spinal stenosis, lumbar region without neurogenic claudication: Secondary | ICD-10-CM | POA: Diagnosis not present

## 2020-05-11 DIAGNOSIS — M5410 Radiculopathy, site unspecified: Secondary | ICD-10-CM

## 2020-05-13 DIAGNOSIS — H52203 Unspecified astigmatism, bilateral: Secondary | ICD-10-CM | POA: Diagnosis not present

## 2020-05-13 DIAGNOSIS — H2513 Age-related nuclear cataract, bilateral: Secondary | ICD-10-CM | POA: Diagnosis not present

## 2020-05-14 NOTE — Telephone Encounter (Signed)
Called and spoke to patient about results - see result note on his MRI.

## 2020-05-18 ENCOUNTER — Other Ambulatory Visit: Payer: Self-pay | Admitting: Geriatric Medicine

## 2020-05-18 ENCOUNTER — Ambulatory Visit
Admission: RE | Admit: 2020-05-18 | Discharge: 2020-05-18 | Disposition: A | Discharge status: Home / Self Care | Payer: BC Managed Care – PPO | Source: Ambulatory Visit | Attending: Geriatric Medicine | Admitting: Geriatric Medicine

## 2020-05-18 ENCOUNTER — Other Ambulatory Visit: Payer: Self-pay

## 2020-05-18 DIAGNOSIS — M5410 Radiculopathy, site unspecified: Secondary | ICD-10-CM

## 2020-05-18 DIAGNOSIS — R109 Unspecified abdominal pain: Secondary | ICD-10-CM

## 2020-05-18 DIAGNOSIS — K4020 Bilateral inguinal hernia, without obstruction or gangrene, not specified as recurrent: Secondary | ICD-10-CM | POA: Diagnosis not present

## 2020-05-18 DIAGNOSIS — I700 Atherosclerosis of aorta: Secondary | ICD-10-CM | POA: Diagnosis not present

## 2020-05-18 DIAGNOSIS — N200 Calculus of kidney: Secondary | ICD-10-CM | POA: Diagnosis not present

## 2020-05-18 DIAGNOSIS — I10 Essential (primary) hypertension: Secondary | ICD-10-CM | POA: Diagnosis not present

## 2020-05-18 DIAGNOSIS — K5730 Diverticulosis of large intestine without perforation or abscess without bleeding: Secondary | ICD-10-CM | POA: Diagnosis not present

## 2020-05-19 ENCOUNTER — Other Ambulatory Visit: Payer: Self-pay | Admitting: Family Medicine

## 2020-05-19 DIAGNOSIS — M5410 Radiculopathy, site unspecified: Secondary | ICD-10-CM

## 2020-05-27 ENCOUNTER — Ambulatory Visit
Admission: RE | Admit: 2020-05-27 | Discharge: 2020-05-27 | Disposition: A | Discharge status: Home / Self Care | Payer: BC Managed Care – PPO | Source: Ambulatory Visit | Attending: Family Medicine | Admitting: Family Medicine

## 2020-05-27 ENCOUNTER — Other Ambulatory Visit: Payer: Self-pay

## 2020-05-27 ENCOUNTER — Other Ambulatory Visit: Payer: Self-pay | Admitting: Family Medicine

## 2020-05-27 DIAGNOSIS — M5126 Other intervertebral disc displacement, lumbar region: Secondary | ICD-10-CM | POA: Diagnosis not present

## 2020-05-27 DIAGNOSIS — M5410 Radiculopathy, site unspecified: Secondary | ICD-10-CM

## 2020-05-27 MED ORDER — METHYLPREDNISOLONE ACETATE 40 MG/ML INJ SUSP (RADIOLOG
120.0000 mg | Freq: Once | INTRAMUSCULAR | Status: AC
  Administered 2020-05-27: 120 mg via EPIDURAL

## 2020-05-27 MED ORDER — IOPAMIDOL (ISOVUE-M 200) INJECTION 41%
1.0000 mL | Freq: Once | INTRAMUSCULAR | Status: AC
  Administered 2020-05-27: 1 mL via EPIDURAL

## 2020-05-27 NOTE — Discharge Instructions (Signed)

## 2020-06-28 DIAGNOSIS — H9313 Tinnitus, bilateral: Secondary | ICD-10-CM | POA: Diagnosis not present

## 2020-06-28 DIAGNOSIS — I10 Essential (primary) hypertension: Secondary | ICD-10-CM | POA: Diagnosis not present

## 2020-07-01 ENCOUNTER — Other Ambulatory Visit: Payer: Self-pay

## 2020-07-01 DIAGNOSIS — M5410 Radiculopathy, site unspecified: Secondary | ICD-10-CM

## 2020-07-05 ENCOUNTER — Other Ambulatory Visit: Payer: Self-pay | Admitting: Family Medicine

## 2020-07-05 DIAGNOSIS — M5416 Radiculopathy, lumbar region: Secondary | ICD-10-CM

## 2020-07-13 ENCOUNTER — Ambulatory Visit
Admission: RE | Admit: 2020-07-13 | Discharge: 2020-07-13 | Disposition: A | Discharge status: Home / Self Care | Payer: BC Managed Care – PPO | Source: Ambulatory Visit | Attending: Family Medicine | Admitting: Family Medicine

## 2020-07-13 ENCOUNTER — Other Ambulatory Visit: Payer: Self-pay

## 2020-07-13 ENCOUNTER — Other Ambulatory Visit: Payer: Self-pay | Admitting: Family Medicine

## 2020-07-13 DIAGNOSIS — M5416 Radiculopathy, lumbar region: Secondary | ICD-10-CM

## 2020-07-13 MED ORDER — IOPAMIDOL (ISOVUE-M 200) INJECTION 41%
1.0000 mL | Freq: Once | INTRAMUSCULAR | Status: AC
  Administered 2020-07-13: 1 mL via EPIDURAL

## 2020-07-13 MED ORDER — METHYLPREDNISOLONE ACETATE 40 MG/ML INJ SUSP (RADIOLOG
120.0000 mg | Freq: Once | INTRAMUSCULAR | Status: AC
  Administered 2020-07-13: 120 mg via EPIDURAL

## 2020-07-13 NOTE — Discharge Instructions (Signed)

## 2020-07-22 DIAGNOSIS — H9313 Tinnitus, bilateral: Secondary | ICD-10-CM | POA: Diagnosis not present

## 2020-07-22 DIAGNOSIS — H903 Sensorineural hearing loss, bilateral: Secondary | ICD-10-CM | POA: Diagnosis not present

## 2020-07-23 DIAGNOSIS — H02051 Trichiasis without entropian right upper eyelid: Secondary | ICD-10-CM | POA: Diagnosis not present

## 2020-09-01 DIAGNOSIS — Z125 Encounter for screening for malignant neoplasm of prostate: Secondary | ICD-10-CM | POA: Diagnosis not present

## 2020-09-01 DIAGNOSIS — Z23 Encounter for immunization: Secondary | ICD-10-CM | POA: Diagnosis not present

## 2020-09-01 DIAGNOSIS — Z79899 Other long term (current) drug therapy: Secondary | ICD-10-CM | POA: Diagnosis not present

## 2020-09-01 DIAGNOSIS — I10 Essential (primary) hypertension: Secondary | ICD-10-CM | POA: Diagnosis not present

## 2020-09-01 DIAGNOSIS — E7800 Pure hypercholesterolemia, unspecified: Secondary | ICD-10-CM | POA: Diagnosis not present

## 2020-09-01 DIAGNOSIS — Z0000 Encounter for general adult medical examination without abnormal findings: Secondary | ICD-10-CM | POA: Diagnosis not present

## 2020-09-10 ENCOUNTER — Other Ambulatory Visit: Payer: Self-pay

## 2020-09-10 ENCOUNTER — Other Ambulatory Visit: Payer: Self-pay | Admitting: Family Medicine

## 2020-09-10 DIAGNOSIS — M5410 Radiculopathy, site unspecified: Secondary | ICD-10-CM

## 2020-09-17 ENCOUNTER — Ambulatory Visit
Admission: RE | Admit: 2020-09-17 | Discharge: 2020-09-17 | Disposition: A | Discharge status: Home / Self Care | Payer: BC Managed Care – PPO | Source: Ambulatory Visit | Attending: Family Medicine | Admitting: Family Medicine

## 2020-09-17 DIAGNOSIS — M5416 Radiculopathy, lumbar region: Secondary | ICD-10-CM | POA: Diagnosis not present

## 2020-09-17 DIAGNOSIS — M5410 Radiculopathy, site unspecified: Secondary | ICD-10-CM

## 2020-09-17 MED ORDER — METHYLPREDNISOLONE ACETATE 40 MG/ML INJ SUSP (RADIOLOG
120.0000 mg | Freq: Once | INTRAMUSCULAR | Status: AC
  Administered 2020-09-17: 120 mg via EPIDURAL

## 2020-09-17 MED ORDER — IOPAMIDOL (ISOVUE-M 200) INJECTION 41%
1.0000 mL | Freq: Once | INTRAMUSCULAR | Status: AC
  Administered 2020-09-17: 1 mL via EPIDURAL

## 2020-09-17 NOTE — Discharge Instructions (Signed)

## 2020-10-27 DIAGNOSIS — M5416 Radiculopathy, lumbar region: Secondary | ICD-10-CM | POA: Diagnosis not present

## 2020-12-09 DIAGNOSIS — M48062 Spinal stenosis, lumbar region with neurogenic claudication: Secondary | ICD-10-CM | POA: Diagnosis not present

## 2020-12-09 DIAGNOSIS — M5116 Intervertebral disc disorders with radiculopathy, lumbar region: Secondary | ICD-10-CM | POA: Diagnosis not present

## 2020-12-09 DIAGNOSIS — M5416 Radiculopathy, lumbar region: Secondary | ICD-10-CM | POA: Diagnosis not present

## 2021-02-02 DIAGNOSIS — Z79899 Other long term (current) drug therapy: Secondary | ICD-10-CM | POA: Diagnosis not present

## 2021-02-02 DIAGNOSIS — I10 Essential (primary) hypertension: Secondary | ICD-10-CM | POA: Diagnosis not present

## 2021-04-14 DIAGNOSIS — I10 Essential (primary) hypertension: Secondary | ICD-10-CM | POA: Diagnosis not present

## 2021-04-14 DIAGNOSIS — Z6830 Body mass index (BMI) 30.0-30.9, adult: Secondary | ICD-10-CM | POA: Diagnosis not present

## 2021-04-14 DIAGNOSIS — Z9889 Other specified postprocedural states: Secondary | ICD-10-CM | POA: Diagnosis not present

## 2021-04-27 DIAGNOSIS — H43811 Vitreous degeneration, right eye: Secondary | ICD-10-CM | POA: Diagnosis not present

## 2021-05-17 DIAGNOSIS — H2513 Age-related nuclear cataract, bilateral: Secondary | ICD-10-CM | POA: Diagnosis not present

## 2021-05-17 DIAGNOSIS — H52203 Unspecified astigmatism, bilateral: Secondary | ICD-10-CM | POA: Diagnosis not present

## 2021-05-17 DIAGNOSIS — H43811 Vitreous degeneration, right eye: Secondary | ICD-10-CM | POA: Diagnosis not present

## 2021-06-02 DIAGNOSIS — Z0100 Encounter for examination of eyes and vision without abnormal findings: Secondary | ICD-10-CM | POA: Diagnosis not present

## 2021-09-27 DIAGNOSIS — E7800 Pure hypercholesterolemia, unspecified: Secondary | ICD-10-CM | POA: Diagnosis not present

## 2021-09-27 DIAGNOSIS — I10 Essential (primary) hypertension: Secondary | ICD-10-CM | POA: Diagnosis not present

## 2021-09-27 DIAGNOSIS — Z23 Encounter for immunization: Secondary | ICD-10-CM | POA: Diagnosis not present

## 2021-09-27 DIAGNOSIS — D369 Benign neoplasm, unspecified site: Secondary | ICD-10-CM | POA: Diagnosis not present

## 2021-09-27 DIAGNOSIS — K4090 Unilateral inguinal hernia, without obstruction or gangrene, not specified as recurrent: Secondary | ICD-10-CM | POA: Diagnosis not present

## 2021-09-27 DIAGNOSIS — L570 Actinic keratosis: Secondary | ICD-10-CM | POA: Diagnosis not present

## 2021-09-27 DIAGNOSIS — Z79899 Other long term (current) drug therapy: Secondary | ICD-10-CM | POA: Diagnosis not present

## 2021-09-27 DIAGNOSIS — Z0000 Encounter for general adult medical examination without abnormal findings: Secondary | ICD-10-CM | POA: Diagnosis not present

## 2021-09-27 DIAGNOSIS — D696 Thrombocytopenia, unspecified: Secondary | ICD-10-CM | POA: Diagnosis not present

## 2021-10-27 DIAGNOSIS — K4020 Bilateral inguinal hernia, without obstruction or gangrene, not specified as recurrent: Secondary | ICD-10-CM | POA: Diagnosis not present

## 2021-11-03 DIAGNOSIS — Z872 Personal history of diseases of the skin and subcutaneous tissue: Secondary | ICD-10-CM | POA: Diagnosis not present

## 2021-11-03 DIAGNOSIS — L578 Other skin changes due to chronic exposure to nonionizing radiation: Secondary | ICD-10-CM | POA: Diagnosis not present

## 2021-11-03 DIAGNOSIS — D225 Melanocytic nevi of trunk: Secondary | ICD-10-CM | POA: Diagnosis not present

## 2021-11-03 DIAGNOSIS — D492 Neoplasm of unspecified behavior of bone, soft tissue, and skin: Secondary | ICD-10-CM | POA: Diagnosis not present

## 2021-11-03 DIAGNOSIS — L821 Other seborrheic keratosis: Secondary | ICD-10-CM | POA: Diagnosis not present

## 2021-11-03 DIAGNOSIS — L812 Freckles: Secondary | ICD-10-CM | POA: Diagnosis not present

## 2021-11-03 DIAGNOSIS — L819 Disorder of pigmentation, unspecified: Secondary | ICD-10-CM | POA: Diagnosis not present

## 2021-11-03 DIAGNOSIS — L738 Other specified follicular disorders: Secondary | ICD-10-CM | POA: Diagnosis not present

## 2021-11-03 DIAGNOSIS — L814 Other melanin hyperpigmentation: Secondary | ICD-10-CM | POA: Diagnosis not present

## 2021-11-03 DIAGNOSIS — L298 Other pruritus: Secondary | ICD-10-CM | POA: Diagnosis not present

## 2021-12-20 DIAGNOSIS — K4020 Bilateral inguinal hernia, without obstruction or gangrene, not specified as recurrent: Secondary | ICD-10-CM | POA: Diagnosis not present

## 2022-03-13 DIAGNOSIS — R0789 Other chest pain: Secondary | ICD-10-CM | POA: Diagnosis not present

## 2022-03-13 DIAGNOSIS — S161XXA Strain of muscle, fascia and tendon at neck level, initial encounter: Secondary | ICD-10-CM | POA: Diagnosis not present

## 2022-04-05 DIAGNOSIS — I700 Atherosclerosis of aorta: Secondary | ICD-10-CM | POA: Diagnosis not present

## 2022-04-05 DIAGNOSIS — I10 Essential (primary) hypertension: Secondary | ICD-10-CM | POA: Diagnosis not present

## 2022-04-05 DIAGNOSIS — E7800 Pure hypercholesterolemia, unspecified: Secondary | ICD-10-CM | POA: Diagnosis not present

## 2022-04-05 DIAGNOSIS — I8393 Asymptomatic varicose veins of bilateral lower extremities: Secondary | ICD-10-CM | POA: Diagnosis not present

## 2022-04-05 DIAGNOSIS — D696 Thrombocytopenia, unspecified: Secondary | ICD-10-CM | POA: Diagnosis not present

## 2022-04-05 DIAGNOSIS — Z79899 Other long term (current) drug therapy: Secondary | ICD-10-CM | POA: Diagnosis not present

## 2022-05-03 ENCOUNTER — Other Ambulatory Visit: Payer: Self-pay | Admitting: *Deleted

## 2022-05-03 DIAGNOSIS — I8393 Asymptomatic varicose veins of bilateral lower extremities: Secondary | ICD-10-CM

## 2022-05-10 DIAGNOSIS — D123 Benign neoplasm of transverse colon: Secondary | ICD-10-CM | POA: Diagnosis not present

## 2022-05-10 DIAGNOSIS — D120 Benign neoplasm of cecum: Secondary | ICD-10-CM | POA: Diagnosis not present

## 2022-05-10 DIAGNOSIS — K649 Unspecified hemorrhoids: Secondary | ICD-10-CM | POA: Diagnosis not present

## 2022-05-10 DIAGNOSIS — K5730 Diverticulosis of large intestine without perforation or abscess without bleeding: Secondary | ICD-10-CM | POA: Diagnosis not present

## 2022-05-10 DIAGNOSIS — Z09 Encounter for follow-up examination after completed treatment for conditions other than malignant neoplasm: Secondary | ICD-10-CM | POA: Diagnosis not present

## 2022-05-10 DIAGNOSIS — Z86010 Personal history of colonic polyps: Secondary | ICD-10-CM | POA: Diagnosis not present

## 2022-05-12 DIAGNOSIS — D123 Benign neoplasm of transverse colon: Secondary | ICD-10-CM | POA: Diagnosis not present

## 2022-05-12 DIAGNOSIS — D120 Benign neoplasm of cecum: Secondary | ICD-10-CM | POA: Diagnosis not present

## 2022-05-18 ENCOUNTER — Ambulatory Visit (HOSPITAL_COMMUNITY)
Admission: RE | Admit: 2022-05-18 | Discharge: 2022-05-18 | Disposition: A | Discharge status: Home / Self Care | Payer: Medicare HMO | Source: Ambulatory Visit | Attending: Vascular Surgery | Admitting: Vascular Surgery

## 2022-05-18 DIAGNOSIS — I8393 Asymptomatic varicose veins of bilateral lower extremities: Secondary | ICD-10-CM | POA: Diagnosis not present

## 2022-05-23 DIAGNOSIS — H2513 Age-related nuclear cataract, bilateral: Secondary | ICD-10-CM | POA: Diagnosis not present

## 2022-05-23 DIAGNOSIS — H52203 Unspecified astigmatism, bilateral: Secondary | ICD-10-CM | POA: Diagnosis not present

## 2022-05-24 ENCOUNTER — Ambulatory Visit: Payer: Medicare HMO | Admitting: Vascular Surgery

## 2022-05-24 ENCOUNTER — Encounter: Payer: Self-pay | Admitting: Vascular Surgery

## 2022-05-24 VITALS — BP 116/71 | HR 78 | Temp 98.3°F | Resp 16 | Ht 70.0 in | Wt 180.0 lb

## 2022-05-24 DIAGNOSIS — I83813 Varicose veins of bilateral lower extremities with pain: Secondary | ICD-10-CM | POA: Diagnosis not present

## 2022-05-24 DIAGNOSIS — I8393 Asymptomatic varicose veins of bilateral lower extremities: Secondary | ICD-10-CM

## 2022-05-24 DIAGNOSIS — I872 Venous insufficiency (chronic) (peripheral): Secondary | ICD-10-CM | POA: Diagnosis not present

## 2022-05-24 NOTE — Progress Notes (Signed)
ASSESSMENT & PLAN   CHRONIC VENOUS INSUFFICIENCY: This patient has CEAP C4 venous disease (hyperpigmentation).  He has some deep venous reflux on the right but also significant superficial venous reflux.  We have discussed the importance of intermittent leg elevation and the proper positioning for this.  In addition we have fitted him for some thigh-high compression stockings with a gradient of 20 to 30 mmHg.  I have encouraged him to avoid prolonged sitting and standing.  We have discussed the importance of exercise specifically walking and water aerobics.  Fortunately he has a healthy weight which is very helpful for patients with venous disease.  I will have him come back in 3 months.  If his symptoms have not progressed significantly, I think he would be a good candidate for endovenous laser ablation of the right great saphenous vein with stab phlebectomies.  The vein is especially large in the thigh up to 6.6 mm that then becomes smaller after he gives off a large tributary.  We would likely try to cannulate the vein in the distal thigh.  I will see him back in 3 months.  He knows to call sooner if he has problems.  He may need formal venous reflux testing on the left and we can obtain this when he comes in for his next visit.  REASON FOR CONSULT:    Painful varicose veins.  The consult is requested by Dr. Lajean Manes.  HPI:   Nicholas Valentine is a 66 y.o. male who was referred with painful varicose veins of both lower extremities.  I have reviewed the records from the referring office.  The patient was seen on 04/05/2022.  He is followed with hypertension and hyperlipidemia.  He also has a history of varicose veins which have been symptomatic.  He was referred for vascular consultation.  On my history the patient has had a long history of varicose veins of both lower extremities.  They are more significant on the right side.  He describes aching pain heaviness and a tired feeling in his legs.   This is aggravated by sitting and standing and relieved with elevation.  He does wear a knee-high compression stocking with a gradient of 15 to 20 mmHg.  This does help his symptoms.  He has had no previous history of DVT and no previous venous procedures.  There are no other aggravating or alleviating factors.  Past Medical History:  Diagnosis Date   Allergic rhinitis    Hypercholesteremia    goal LDL less than 130- 10 year risk 8.8 on 7/14   Hypertension    Thrombocytopenia (HCC)    130,000 in 2010-Dr. HA-due to simvastatin    Family History  Problem Relation Age of Onset   Arrhythmia Mother        afib   Hypertension Mother    Other Mother        died in [place] with husband   Alzheimer's disease Father    Heart disease Father    Other Father        died in MVA    SOCIAL HISTORY: Social History   Tobacco Use   Smoking status: Never   Smokeless tobacco: Not on file  Substance Use Topics   Alcohol use: Yes    Allergies  Allergen Reactions   Simvastatin Other (See Comments)    Thrombocytopenia (baseline Platelets ~ 140k)    Current Outpatient Medications  Medication Sig Dispense Refill   carboxymethylcellulose (REFRESH PLUS) 0.5 %  SOLN 2 drops 2 (two) times daily as needed (dry eyes).     lisinopril (PRINIVIL,ZESTRIL) 10 MG tablet Take 10 mg by mouth daily.     loratadine (CLARITIN) 10 MG tablet Take 10 mg by mouth daily.     Multiple Vitamins-Minerals (MULTIVITAMIN WITH MINERALS) tablet Take 1 tablet by mouth daily.     Omega-3 Fatty Acids (FISH OIL) 1000 MG CAPS Take 1 capsule by mouth daily.     aspirin 81 MG tablet Take 81 mg by mouth daily.     polyethylene glycol-electrolytes (NULYTELY/GOLYTELY) 420 g solution Take 4,000 mLs by mouth as directed.   0   predniSONE (DELTASONE) 10 MG tablet 6 tabs po day 1, 5 tabs po day 2, 4 tabs po day 3, 3 tabs po day 4, 2 tabs po day 5, 1 tab po day 6 21 tablet 0   zolpidem (AMBIEN) 10 MG tablet Take 10 mg by mouth at bedtime as  needed for sleep (only when traveling).      No current facility-administered medications for this visit.    REVIEW OF SYSTEMS:  '[X]'$  denotes positive finding, '[ ]'$  denotes negative finding Cardiac  Comments:  Chest pain or chest pressure:    Shortness of breath upon exertion:    Short of breath when lying flat:    Irregular heart rhythm:        Vascular    Pain in calf, thigh, or hip brought on by ambulation:    Pain in feet at night that wakes you up from your sleep:     Blood clot in your veins:    Leg swelling:         Pulmonary    Oxygen at home:    Productive cough:     Wheezing:         Neurologic    Sudden weakness in arms or legs:     Sudden numbness in arms or legs:     Sudden onset of difficulty speaking or slurred speech:    Temporary loss of vision in one eye:     Problems with dizziness:         Gastrointestinal    Blood in stool:     Vomited blood:         Genitourinary    Burning when urinating:     Blood in urine:        Psychiatric    Major depression:         Hematologic    Bleeding problems:    Problems with blood clotting too easily:        Skin    Rashes or ulcers:        Constitutional    Fever or chills:    -  PHYSICAL EXAM:   Vitals:   05/24/22 1520  BP: 116/71  Pulse: 78  Resp: 16  Temp: 98.3 F (36.8 C)  TempSrc: Temporal  SpO2: 99%  Weight: 180 lb (81.6 kg)  Height: '5\' 10"'$  (1.778 m)   Body mass index is 25.83 kg/m. GENERAL: The patient is a well-nourished male, in no acute distress. The vital signs are documented above. CARDIAC: There is a regular rate and rhythm.  VASCULAR: I do not detect carotid bruits. He has palpable pedal pulses. He has large varicose veins of both lower extremities as documented in the photographs below.     I did look at his right great saphenous vein myself with the SonoSite.  He has reflux down  to the proximal calf on the right.  The vein is smaller in the distal thigh and he has some  large overlying varicosities in the distal thigh.  The vein would likely be cannulated in the distal thigh. PULMONARY: There is good air exchange bilaterally without wheezing or rales. ABDOMEN: Soft and non-tender.  I do not palpate an abdominal aortic aneurysm. MUSCULOSKELETAL: There are no major deformities. NEUROLOGIC: No focal weakness or paresthesias are detected. SKIN: There are no ulcers or rashes noted. PSYCHIATRIC: The patient has a normal affect.  DATA:    VENOUS DUPLEX: I have independently interpreted his venous duplex scan that was done on 05/18/2022.  This was of the right lower extremity only.  On the right side there was no evidence of DVT.  There was deep venous reflux in the distal femoral vein and the popliteal vein.  There was superficial venous reflux in the right great saphenous vein down to the proximal calf.  Diameters of the vein ranged from 3.6-6.6 mm.  There was also reflux in the small saphenous vein with diameters ranging from 2.6-2.8 mm.     Deitra Mayo [facility]

## 2022-05-30 ENCOUNTER — Other Ambulatory Visit: Payer: Self-pay

## 2022-05-30 DIAGNOSIS — I8393 Asymptomatic varicose veins of bilateral lower extremities: Secondary | ICD-10-CM

## 2022-05-30 DIAGNOSIS — I83813 Varicose veins of bilateral lower extremities with pain: Secondary | ICD-10-CM

## 2022-05-30 DIAGNOSIS — I872 Venous insufficiency (chronic) (peripheral): Secondary | ICD-10-CM

## 2022-08-19 DIAGNOSIS — H5203 Hypermetropia, bilateral: Secondary | ICD-10-CM | POA: Diagnosis not present

## 2022-08-19 DIAGNOSIS — H52209 Unspecified astigmatism, unspecified eye: Secondary | ICD-10-CM | POA: Diagnosis not present

## 2022-08-19 DIAGNOSIS — H524 Presbyopia: Secondary | ICD-10-CM | POA: Diagnosis not present

## 2022-08-23 ENCOUNTER — Ambulatory Visit: Payer: Medicare HMO | Admitting: Vascular Surgery

## 2022-08-23 ENCOUNTER — Encounter (HOSPITAL_COMMUNITY): Payer: Medicare HMO

## 2022-08-30 ENCOUNTER — Ambulatory Visit (HOSPITAL_COMMUNITY)
Admission: RE | Admit: 2022-08-30 | Discharge: 2022-08-30 | Disposition: A | Discharge status: Home / Self Care | Payer: Medicare HMO | Source: Ambulatory Visit | Attending: Vascular Surgery | Admitting: Vascular Surgery

## 2022-08-30 ENCOUNTER — Ambulatory Visit: Payer: Medicare HMO | Admitting: Vascular Surgery

## 2022-08-30 DIAGNOSIS — I83813 Varicose veins of bilateral lower extremities with pain: Secondary | ICD-10-CM | POA: Insufficient documentation

## 2022-08-30 DIAGNOSIS — I8393 Asymptomatic varicose veins of bilateral lower extremities: Secondary | ICD-10-CM | POA: Insufficient documentation

## 2022-08-30 DIAGNOSIS — I872 Venous insufficiency (chronic) (peripheral): Secondary | ICD-10-CM | POA: Diagnosis not present

## 2022-09-27 ENCOUNTER — Ambulatory Visit: Payer: Medicare HMO | Admitting: Vascular Surgery

## 2022-10-04 ENCOUNTER — Encounter: Payer: Self-pay | Admitting: Vascular Surgery

## 2022-10-04 ENCOUNTER — Ambulatory Visit: Payer: Medicare HMO | Admitting: Vascular Surgery

## 2022-10-04 VITALS — BP 131/77 | HR 85 | Temp 97.8°F | Resp 16 | Ht 70.0 in | Wt 184.0 lb

## 2022-10-04 DIAGNOSIS — I83813 Varicose veins of bilateral lower extremities with pain: Secondary | ICD-10-CM

## 2022-10-04 DIAGNOSIS — I872 Venous insufficiency (chronic) (peripheral): Secondary | ICD-10-CM

## 2022-10-04 NOTE — Progress Notes (Signed)
REASON FOR VISIT:   Follow-up of chronic venous insufficiency.  MEDICAL ISSUES:   CHRONIC VENOUS INSUFFICIENCY: This patient has C4 venous disease.  He has painful varicose veins of both lower extremities and venous hypertension with hyperpigmentation.  He has failed conservative treatment and continues to have significant symptoms including aching and heaviness in both legs.  I think he would be a good candidate for staged laser ablation of both great saphenous veins with greater than 20 stab phlebectomies bilaterally.  Symptoms are more significant on the right side we will start on the right side.  I felt that we could likely cannulate the vein in the distal thigh on the right.  I have discussed the indications for endovenous laser ablation of the Right GSV, that is to lower the pressure in the veins and potentially help relieve the symptoms from venous hypertension.  I have also discussed alternative options such as conservative treatment as described above. I have discussed the potential complications of the procedure, including bleeding, bruising, leg swelling, deep venous thrombosis (<1% risk), or failure of the vein to close <1% risk).  I have also explained that venous insufficiency is a chronic disease, and that the patient is at risk for recurrent varicose veins in the future.  All of the patient's questions were encouraged and answered. They are agreeable to proceed.   I have discussed with the patient the indications for stab phlebectomy.  I have explained to the patient that that will have small scars from the stab incisions.  I explained that the other risks include bruising, bleeding, and phlebitis.     HPI:   Nicholas Valentine is a pleasant 66 y.o. male who I saw back on 05/24/2022 with chronic venous insufficiency.  He had C4 venous disease.  He had enlarged varicose veins along medial aspect of his right thigh and right calf. On the right side he had deep venous reflux in the  popliteal vein.  He had significant superficial venous reflux in the right great saphenous vein down to the proximal calf.  We discussed conservative measures including importance of leg elevation.  He was fitted for thigh-high compression stockings with a gradient of 20 to 30 mmHg.  He was encouraged to exercise and avoid prolonged sitting and standing.  He was set up for a 72-monthfollow-up visit.  I felt that if his symptoms progressed he would be a candidate for laser ablation of the right great saphenous vein with stab phlebectomies.  The vein was very large in the thigh up to 7 mm but became smaller after giving off a large tributary.  I felt that we would likely cannulate the vein in the distal thigh.  Since I saw him last, he has had formal venous reflux testing on the left that was done on 08/30/2022.  The results are summarized below.  He is continuing to have significant aching pain and heaviness in both legs which is aggravated by standing and relieved with elevation and compression therapy.  He has been wearing his thigh-high compression stockings.  These do help some.  However, he is continuing to have significant symptoms from venous hypertension.  Past Medical History:  Diagnosis Date   Allergic rhinitis    Hypercholesteremia    goal LDL less than 130- 10 year risk 8.8 on 7/14   Hypertension    Thrombocytopenia (HCC)    130,000 in 2010-Dr. HA-due to simvastatin    Family History  Problem Relation Age of Onset  Arrhythmia Mother        afib   Hypertension Mother    Other Mother        died in [place] with husband   Alzheimer's disease Father    Heart disease Father    Other Father        died in MVA    SOCIAL HISTORY: Social History   Tobacco Use   Smoking status: Never   Smokeless tobacco: Not on file  Substance Use Topics   Alcohol use: Yes    Allergies  Allergen Reactions   Simvastatin Other (See Comments)    Thrombocytopenia (baseline Platelets ~ 140k)     Current Outpatient Medications  Medication Sig Dispense Refill   carboxymethylcellulose (REFRESH PLUS) 0.5 % SOLN 2 drops 2 (two) times daily as needed (dry eyes).     lisinopril (PRINIVIL,ZESTRIL) 10 MG tablet Take 10 mg by mouth daily.     loratadine (CLARITIN) 10 MG tablet Take 10 mg by mouth daily.     Multiple Vitamins-Minerals (MULTIVITAMIN WITH MINERALS) tablet Take 1 tablet by mouth daily.     Omega-3 Fatty Acids (FISH OIL) 1000 MG CAPS Take 1 capsule by mouth daily.     aspirin 81 MG tablet Take 81 mg by mouth daily.     polyethylene glycol-electrolytes (NULYTELY/GOLYTELY) 420 g solution Take 4,000 mLs by mouth as directed.   0   predniSONE (DELTASONE) 10 MG tablet 6 tabs po day 1, 5 tabs po day 2, 4 tabs po day 3, 3 tabs po day 4, 2 tabs po day 5, 1 tab po day 6 21 tablet 0   zolpidem (AMBIEN) 10 MG tablet Take 10 mg by mouth at bedtime as needed for sleep (only when traveling).      No current facility-administered medications for this visit.    REVIEW OF SYSTEMS:  '[X]'$  denotes positive finding, '[ ]'$  denotes negative finding Cardiac  Comments:  Chest pain or chest pressure:    Shortness of breath upon exertion:    Short of breath when lying flat:    Irregular heart rhythm:        Vascular    Pain in calf, thigh, or hip brought on by ambulation:    Pain in feet at night that wakes you up from your sleep:     Blood clot in your veins:    Leg swelling:         Pulmonary    Oxygen at home:    Productive cough:     Wheezing:         Neurologic    Sudden weakness in arms or legs:     Sudden numbness in arms or legs:     Sudden onset of difficulty speaking or slurred speech:    Temporary loss of vision in one eye:     Problems with dizziness:         Gastrointestinal    Blood in stool:     Vomited blood:         Genitourinary    Burning when urinating:     Blood in urine:        Psychiatric    Major depression:         Hematologic    Bleeding problems:     Problems with blood clotting too easily:        Skin    Rashes or ulcers:        Constitutional    Fever or chills:  PHYSICAL EXAM:   Vitals:   10/04/22 1423  BP: 131/77  Pulse: 85  Resp: 16  Temp: 97.8 F (36.6 C)  TempSrc: Temporal  SpO2: 100%  Weight: 184 lb (83.5 kg)  Height: '5\' 10"'$  (1.778 m)    GENERAL: The patient is a well-nourished male, in no acute distress. The vital signs are documented above. CARDIAC: There is a regular rate and rhythm.  VASCULAR: I do not detect carotid bruits. He has palpable pedal pulses. He has markedly enlarged varicose veins under significantly elevated pressure bilaterally.  PULMONARY: There is good air exchange bilaterally without wheezing or rales. ABDOMEN: Soft and non-tender with normal pitched bowel sounds.  MUSCULOSKELETAL: There are no major deformities or cyanosis. NEUROLOGIC: No focal weakness or paresthesias are detected. SKIN: There are no ulcers or rashes noted. PSYCHIATRIC: The patient has a normal affect.  DATA:    VENOUS DUPLEX: I have reviewed the venous duplex scan that was done on 08/30/2022 of the left lower extremity.  This showed no evidence of DVT.  There was deep venous reflux in the common femoral vein.  There was significant superficial venous reflux in the left great saphenous vein down to the proximal calf.  Diameters of the vein ranged from 5 to 13 mm.  The results of the study are summarized on the diagram below.    Previous study on the right side is summarized below.    A total of 40 minutes was spent on this visit. 20 minutes was face to face time. More than 50% of the time was spent on counseling and coordinating with the patient.    Deitra Mayo [facility] [phone]

## 2022-10-10 ENCOUNTER — Other Ambulatory Visit: Payer: Self-pay | Admitting: *Deleted

## 2022-10-10 DIAGNOSIS — I83891 Varicose veins of right lower extremities with other complications: Secondary | ICD-10-CM

## 2022-10-10 DIAGNOSIS — I83892 Varicose veins of left lower extremities with other complications: Secondary | ICD-10-CM

## 2022-10-11 ENCOUNTER — Ambulatory Visit: Payer: Medicare HMO | Admitting: Vascular Surgery

## 2022-10-19 ENCOUNTER — Other Ambulatory Visit: Payer: Self-pay | Admitting: *Deleted

## 2022-10-19 MED ORDER — LORAZEPAM 1 MG PO TABS: ORAL_TABLET | ORAL | 0 refills | Status: AC

## 2022-10-20 IMAGING — XA Imaging study
4 series · 4 of 4 positions shown · IV contrast (isovue)
Comparison: none

CLINICAL DATA: Radiculopathy of the leg. Patient has had 2 previous
right L3 nerve root blocks. In both cases he is at some proven of
the pain for approximately 1 month. Further discussion with the
patient reveals pain is prominent along the lateral aspect of the
right thigh and previously along the anterior and lateral aspect of
right calf. Patient denies pain in the anterior.

EXAM:
EXAM
Right L4 NERVE ROOT BLOCK WITH TRANSFORAMINAL EPIDURAL STEROID
INJECTION
TECHNIQUE: Overlying skin prepped with Betadine, draped in the usual sterile
fashion. Curved 22 gauge spinal needle directed into the right L4-5
neuroforamen. Diagnostic injection with 3ml Isovue-M 200 contrast
demonstrates good epidural spread partially outlining the right L4
nerve root. No intravascular uptake. 120mg Depo-Medrol and 3ml 1%
lidocaine were subsequently administered. No immediate complication.
The patient tolerated the procedure without difficulty. They were
transferred to recovery in excellent condition.
FLUOROSCOPY TIME:  Radiation Exposure Index (as provided by the
fluoroscopic device): 46.87 uGy*m2

[Series 1: ortho adipose · 1 of 1 slices shown (1 of 4)]
[im 1/1]
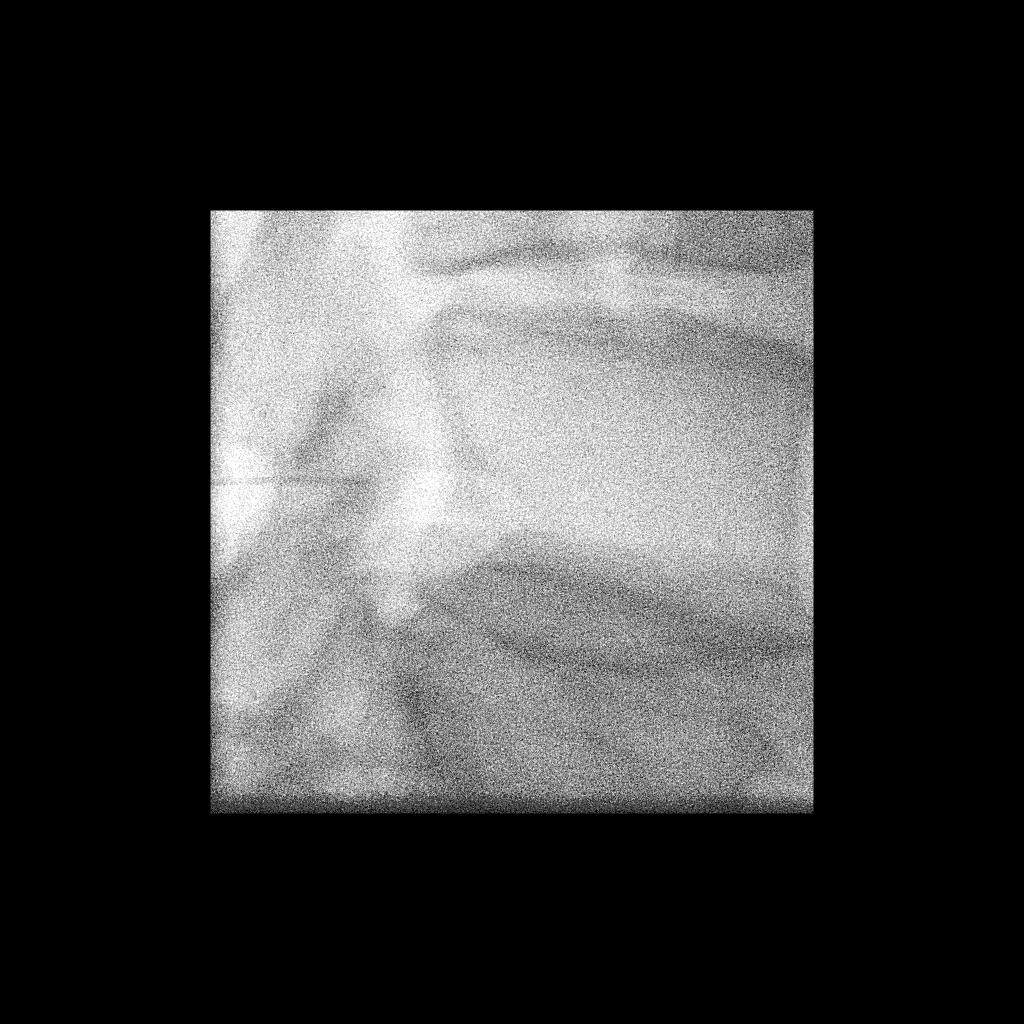

[Series 2: ortho adipose · 1 of 1 slices shown (2 of 4)]
[im 1/1]
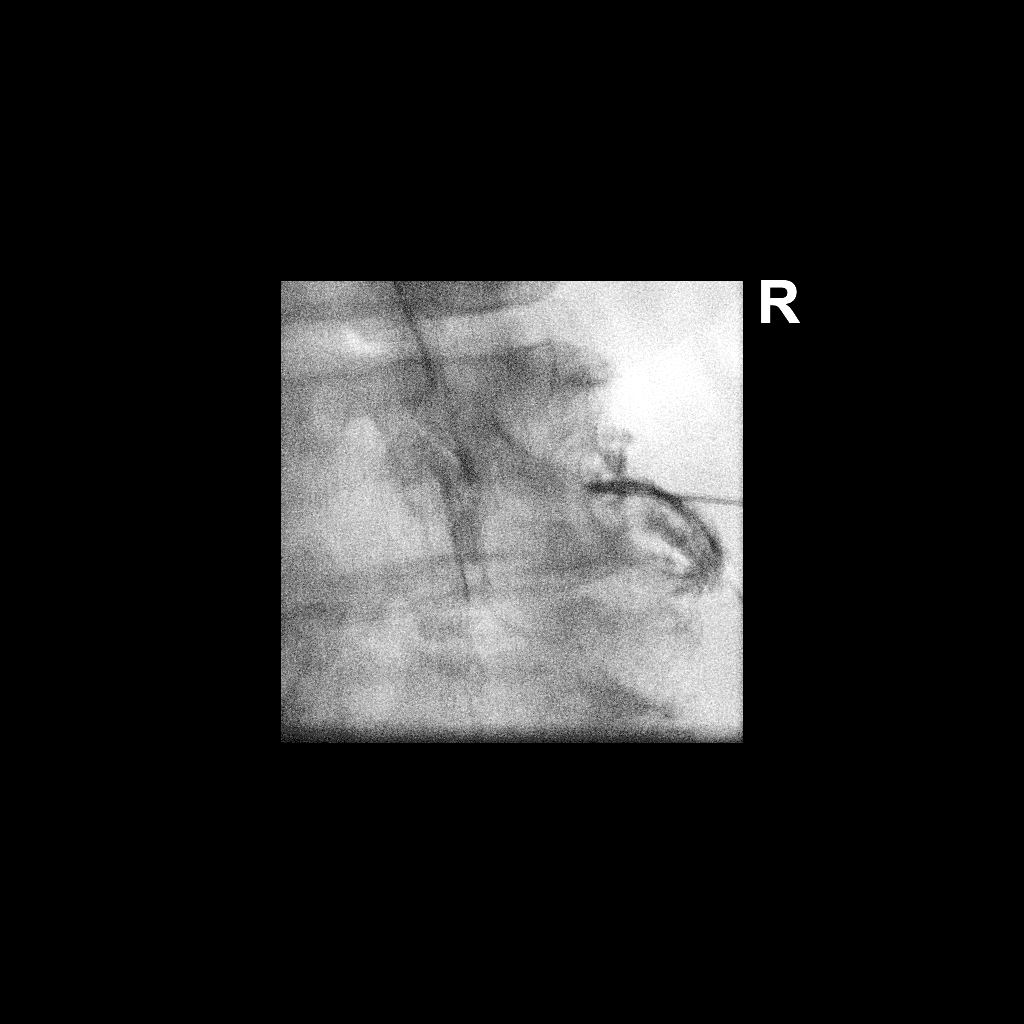

[Series 3: ortho adipose · 1 of 1 slices shown (3 of 4)]
[im 1/1]
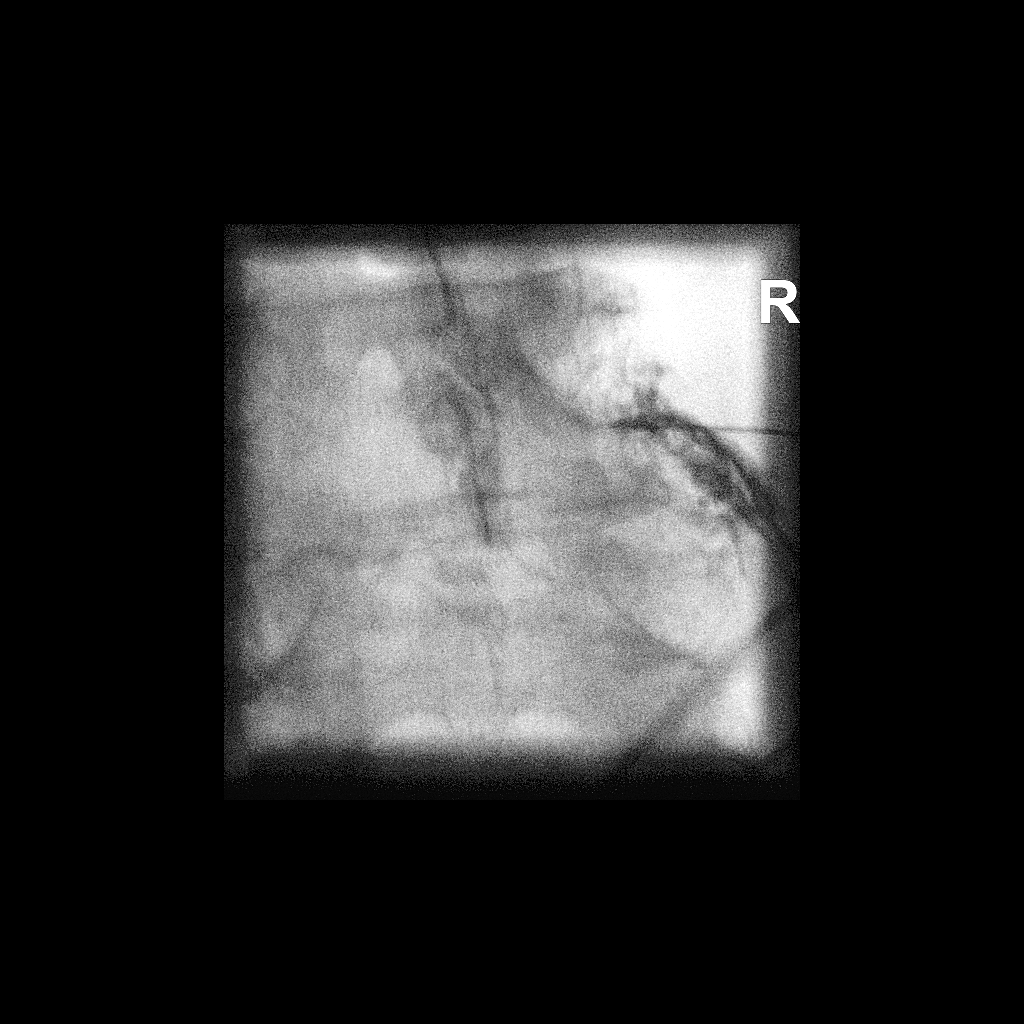

[Series 4: ortho adipose · 1 of 1 slices shown (4 of 4)]
[im 1/1]
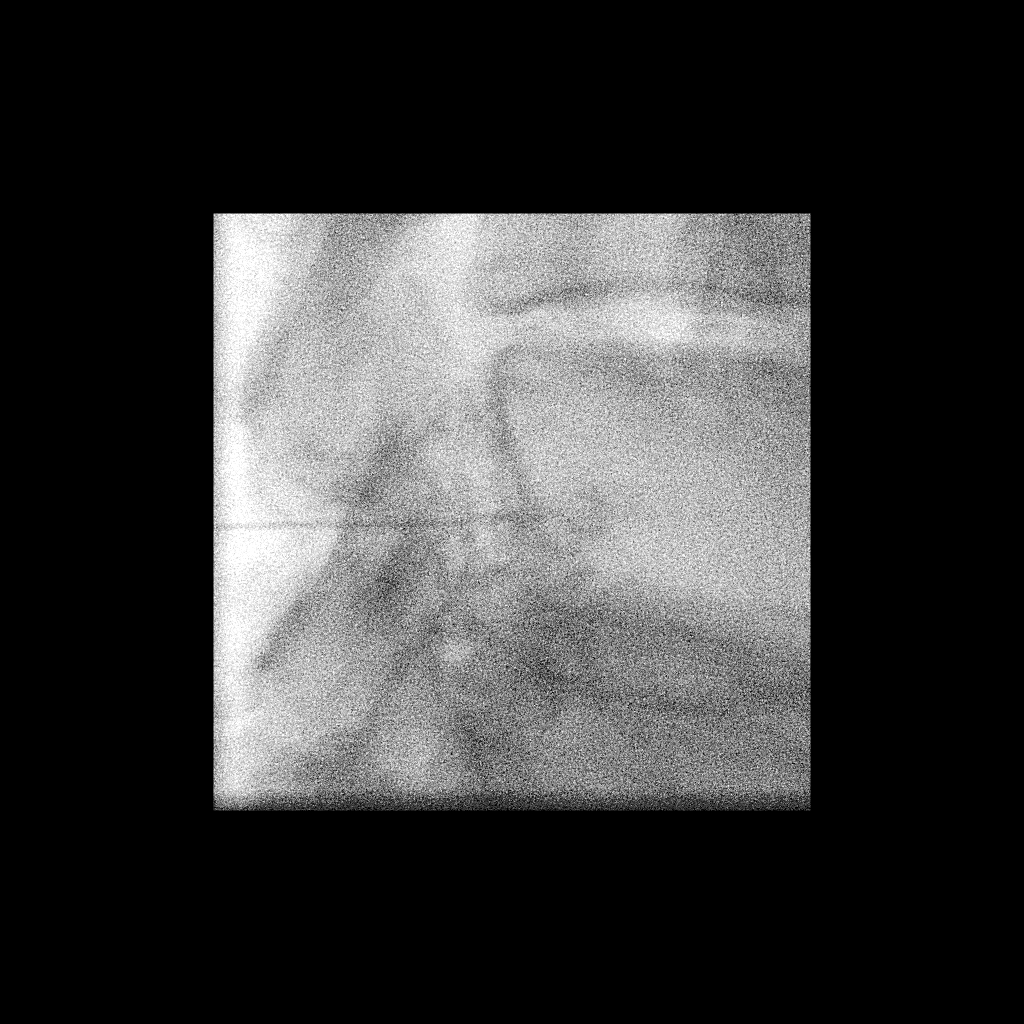

[4 of 4 positions shown; findings below may reference images not displayed]

IMPRESSION: Technically successful right OOselective nerve root block with
transforaminal epidural steroid injection.

Given the distribution of pain and somewhat short lived success with
previous L3 injections, L4 injection was attempted today. Patient
will assess for difference in outcome.

## 2022-10-25 ENCOUNTER — Encounter: Payer: Self-pay | Admitting: Vascular Surgery

## 2022-10-25 ENCOUNTER — Ambulatory Visit: Payer: Medicare HMO | Admitting: Vascular Surgery

## 2022-10-25 VITALS — BP 144/82 | HR 73 | Temp 97.9°F | Resp 16 | Ht 70.0 in | Wt 181.0 lb

## 2022-10-25 DIAGNOSIS — I872 Venous insufficiency (chronic) (peripheral): Secondary | ICD-10-CM | POA: Diagnosis not present

## 2022-10-25 DIAGNOSIS — I83813 Varicose veins of bilateral lower extremities with pain: Secondary | ICD-10-CM

## 2022-10-25 HISTORY — PX: ENDOVENOUS ABLATION SAPHENOUS VEIN W/ LASER: SUR449

## 2022-10-25 NOTE — Progress Notes (Signed)
Patient name: Nicholas Valentine MRN: [id number] DOB: Jun 14, 1956 Sex: male  REASON FOR VISIT: For laser ablation of the right great saphenous vein and greater than 20 stabs  HPI: Nicholas Valentine is a 67 y.o. male who I last saw on 10/04/2022.  He has C4 venous disease.  He has painful varicose veins of both lower extremities and has failed conservative treatment.  I felt that he would be a good candidate for staged laser ablation of both great saphenous vein with greater than 20 stabs.  His symptoms are more significant on the right side so we elected to start here.  I felt that I would likely cannulate the vein in the distal thigh on the right.  Current Outpatient Medications  Medication Sig Dispense Refill   carboxymethylcellulose (REFRESH PLUS) 0.5 % SOLN 2 drops 2 (two) times daily as needed (dry eyes).     lisinopril (PRINIVIL,ZESTRIL) 10 MG tablet Take 10 mg by mouth daily.     loratadine (CLARITIN) 10 MG tablet Take 10 mg by mouth daily.     LORazepam (ATIVAN) 1 MG tablet Take 1 tablet 30 minutes prior to leaving house on day of office surgery.  Bring second tablet with you to office on day of office surgery. 2 tablet 0   Multiple Vitamins-Minerals (MULTIVITAMIN WITH MINERALS) tablet Take 1 tablet by mouth daily.     Omega-3 Fatty Acids (FISH OIL) 1000 MG CAPS Take 1 capsule by mouth daily.     aspirin 81 MG tablet Take 81 mg by mouth daily.     polyethylene glycol-electrolytes (NULYTELY/GOLYTELY) 420 g solution Take 4,000 mLs by mouth as directed.   0   predniSONE (DELTASONE) 10 MG tablet 6 tabs po day 1, 5 tabs po day 2, 4 tabs po day 3, 3 tabs po day 4, 2 tabs po day 5, 1 tab po day 6 21 tablet 0   zolpidem (AMBIEN) 10 MG tablet Take 10 mg by mouth at bedtime as needed for sleep (only when traveling).      No current facility-administered medications for this visit.    PHYSICAL EXAM: Vitals:   10/25/22 0825  BP: (!) 144/82  Pulse: 73  Resp: 16  Temp: 97.9 F (36.6 C)  TempSrc:  Temporal  SpO2: 100%  Weight: 181 lb (82.1 kg)  Height: '5\' 10"'$  (1.778 m)    PROCEDURE: Laser ablation of the right great saphenous vein with greater than 20 stabs  TECHNIQUE: Patient was taken to the exam room and dilated varicose veins were marked with the patient standing.  The patient was then placed supine.  I looked at the right great saphenous vein myself with the SonoSite and I felt that I could cannulate this at the junction of the mid and distal thigh.  The right leg was prepped and draped in the usual sterile fashion.  Under ultrasound guidance, after the skin was anesthetized, I cannulated the right great saphenous vein with a micropuncture needle and a micropuncture sheath was introduced over wire.  I advanced the J-wire to just below the saphenofemoral junction.  A 45 cm sheath was advanced over the wire and the wire and dilator removed.  Laser fiber was positioned at the end of the sheath and the sheath was retracted.  I position the laser fiber 2.5 cm distal to the saphenofemoral junction.  Tumescent anesthesia was administered circumferentially around the vein.  Next the patient was placed in Trendelenburg.  Laser ablation was performed of the right great  saphenous vein from 2.5 cm distal to the saphenofemoral junction to the junction of the mid and distal thigh.  50 J/cm was used at 20 W..  Next attention was turned to stab phlebectomies.  Approximately 25-30 small stab incisions were made over the marked areas.  The vein's were grasped with a hook brought above the skin and then grasped with a hemostat and bluntly excised.  Pressure was held for hemostasis.  At the completion Steri-Strips were applied.  A pressure dressing was applied.  The patient tolerated the procedure well.  He will return in 1 week for a follow-up duplex.  Deitra Mayo [facility] [phone]

## 2022-10-25 NOTE — Progress Notes (Signed)
Laser Ablation Procedure    Date: 10/25/2022   Nicholas Valentine DOB:Dec 27, 1955  Consent signed: Yes      Surgeon: Gae Gallop MD   Procedure: Laser Ablation: right Greater Saphenous Vein  BP (!) 144/82 (BP Location: Right Arm, Patient Position: Sitting, Cuff Size: Normal)   Pulse 73   Temp 97.9 F (36.6 C) (Temporal)   Resp 16   Ht '5\' 10"'$  (1.778 m)   Wt 181 lb (82.1 kg)   SpO2 100%   BMI 25.97 kg/m   Tumescent Anesthesia: 700  cc 0.9% NaCl with 50 cc Lidocaine HCL 1%  and 15 cc 8.4% NaHCO3  Local Anesthesia: 10 cc Lidocaine HCL and NaHCO3 (ratio 2:1)  7 watts continuous mode     Total energy: 1234 Joules    Total time: 176 seconds Treatment Length 24 cm   Laser Fiber Ref. #   [id number]   Lot #  [id number]   Stab Phlebectomy: >20 incisions  Sites: Thigh and Calf  Patient tolerated procedure well  Notes: All staff members wore facial masks.  [name] took Ativan 1 mg (1 tablet) on 10-25-2022 at 7:10 AM and at 8:15 AM.     Description of Procedure:  After marking the course of the secondary varicosities, the patient was placed on the operating table in the supine position, and the right leg was prepped and draped in sterile fashion.   Local anesthetic was administered and under ultrasound guidance the saphenous vein was accessed with a micro needle and guide wire; then the mirco puncture sheath was placed.  A guide wire was inserted saphenofemoral junction , followed by a 5 french sheath.  The position of the sheath and then the laser fiber below the junction was confirmed using the ultrasound.  Tumescent anesthesia was administered along the course of the saphenous vein using ultrasound guidance. The patient was placed in Trendelenburg position and protective laser glasses were placed on patient and staff, and the laser was fired at 7 watts continuous mode for a total of 1234  joules.   For stab phlebectomies, local anesthetic was administered at the previously marked  varicosities, and tumescent anesthesia was administered around the vessels.  Greater than 20 stab wounds were made using the tip of an 11 blade. And using the vein hook, the phlebectomies were performed using a hemostat to avulse the varicosities.  Adequate hemostasis was achieved.     Steri strips were applied to the stab wounds and ABD pads and thigh high compression stockings were applied.  Ace wrap bandages were applied over the phlebectomy sites and at the top of the saphenofemoral junction. Blood loss was less than 15 cc.  Discharge instructions reviewed with patient and hardcopy of discharge instructions given to patient to take home. The patient ambulated out of the operating room having tolerated the procedure well.

## 2022-11-01 ENCOUNTER — Ambulatory Visit (INDEPENDENT_AMBULATORY_CARE_PROVIDER_SITE_OTHER): Payer: Medicare HMO | Admitting: Vascular Surgery

## 2022-11-01 ENCOUNTER — Ambulatory Visit (HOSPITAL_COMMUNITY)
Admission: RE | Admit: 2022-11-01 | Discharge: 2022-11-01 | Disposition: A | Discharge status: Home / Self Care | Payer: Medicare HMO | Source: Ambulatory Visit | Attending: Vascular Surgery | Admitting: Vascular Surgery

## 2022-11-01 ENCOUNTER — Encounter: Payer: Self-pay | Admitting: Vascular Surgery

## 2022-11-01 VITALS — BP 126/80 | HR 69 | Temp 98.3°F | Resp 16 | Ht 70.0 in | Wt 182.0 lb

## 2022-11-01 DIAGNOSIS — I872 Venous insufficiency (chronic) (peripheral): Secondary | ICD-10-CM

## 2022-11-01 DIAGNOSIS — I8393 Asymptomatic varicose veins of bilateral lower extremities: Secondary | ICD-10-CM

## 2022-11-01 DIAGNOSIS — I83892 Varicose veins of left lower extremities with other complications: Secondary | ICD-10-CM | POA: Diagnosis not present

## 2022-11-01 NOTE — Progress Notes (Signed)
Patient name: Nicholas Valentine MRN: [id number] DOB: 1956-06-13 Sex: male  REASON FOR VISIT: Follow-up after laser ablation of the right great saphenous vein with greater than 20 stabs  HPI: Nicholas Valentine is a 67 y.o. 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  He has C4 venous disease.  On 10/25/2022 he underwent laser ablation of the right great saphenous vein with greater than 20 stabs.  Today, he has no specific complaints.  He is anxious to begin exercising again.  Of note he is scheduled for laser ablation of the left great saphenous vein and greater than 20 stabs on 11/15/2022.  Current Outpatient Medications  Medication Sig Dispense Refill   aspirin 81 MG tablet Take 81 mg by mouth daily.     carboxymethylcellulose (REFRESH PLUS) 0.5 % SOLN 2 drops 2 (two) times daily as needed (dry eyes).     lisinopril (PRINIVIL,ZESTRIL) 10 MG tablet Take 10 mg by mouth daily.     loratadine (CLARITIN) 10 MG tablet Take 10 mg by mouth daily.     LORazepam (ATIVAN) 1 MG tablet Take 1 tablet 30 minutes prior to leaving house on day of office surgery.  Bring second tablet with you to office on day of office surgery. 2 tablet 0   Multiple Vitamins-Minerals (MULTIVITAMIN WITH MINERALS) tablet Take 1 tablet by mouth daily.     Omega-3 Fatty Acids (FISH OIL) 1000 MG CAPS Take 1 capsule by mouth daily.     polyethylene glycol-electrolytes (NULYTELY/GOLYTELY) 420 g solution Take 4,000 mLs by mouth as directed.   0   predniSONE (DELTASONE) 10 MG tablet 6 tabs po day 1, 5 tabs po day 2, 4 tabs po day 3, 3 tabs po day 4, 2 tabs po day 5, 1 tab po day 6 21 tablet 0   zolpidem (AMBIEN) 10 MG tablet Take 10 mg by mouth at bedtime as needed for sleep (only when traveling).      No current facility-administered medications for this visit.   REVIEW OF SYSTEMS: Valu.Nieves ] denotes positive finding; [  ] denotes negative finding  CARDIOVASCULAR:  '[ ]'$  chest pain   '[ ]'$  dyspnea on exertion  '[ ]'$  leg  swelling  CONSTITUTIONAL:  '[ ]'$  fever   '[ ]'$  chills  PHYSICAL EXAM: Vitals:   11/01/22 1030  BP: 126/80  Pulse: 69  Resp: 16  Temp: 98.3 F (36.8 C)  TempSrc: Temporal  SpO2: 98%  Weight: 182 lb (82.6 kg)  Height: '5\' 10"'$  (1.778 m)   GENERAL: The patient is a well-nourished male, in no acute distress. The vital signs are documented above. CARDIOVASCULAR: There is a regular rate and rhythm. PULMONARY: There is good air exchange bilaterally without wheezing or rales. VASCULAR: He has moderate bruising in his medial thigh and right leg.  DATA:  VENOUS DUPLEX: I have independently interpreted his venous duplex scan today.  There is no evidence of DVT.  The right great saphenous vein is successfully closed from the distal thigh to within 11 mm of the saphenofemoral junction.  MEDICAL ISSUES:  S/P LASER ABLATION RIGHT GREAT SAPHENOUS VEIN WITH GREATER THAN 20 STABS: Patient is doing well status post laser ablation of the right great saphenous vein and stab phlebectomies.  He has moderate bruising.  He has 1 more week with his thigh-high compression stocking.  I encouraged him to gradually resume his normal routine.  He can begin exercising using light weights.  He is scheduled for laser  ablation of the left great saphenous vein and greater than 20 stabs on 11/15/2022.  Deitra Mayo [facility] [place] [phone]

## 2022-11-06 DIAGNOSIS — D492 Neoplasm of unspecified behavior of bone, soft tissue, and skin: Secondary | ICD-10-CM | POA: Diagnosis not present

## 2022-11-06 DIAGNOSIS — L821 Other seborrheic keratosis: Secondary | ICD-10-CM | POA: Diagnosis not present

## 2022-11-06 DIAGNOSIS — L570 Actinic keratosis: Secondary | ICD-10-CM | POA: Diagnosis not present

## 2022-11-06 DIAGNOSIS — D225 Melanocytic nevi of trunk: Secondary | ICD-10-CM | POA: Diagnosis not present

## 2022-11-06 DIAGNOSIS — L814 Other melanin hyperpigmentation: Secondary | ICD-10-CM | POA: Diagnosis not present

## 2022-11-06 DIAGNOSIS — Z872 Personal history of diseases of the skin and subcutaneous tissue: Secondary | ICD-10-CM | POA: Diagnosis not present

## 2022-11-08 ENCOUNTER — Other Ambulatory Visit: Payer: Self-pay | Admitting: *Deleted

## 2022-11-08 MED ORDER — LORAZEPAM 1 MG PO TABS: ORAL_TABLET | ORAL | 0 refills | Status: AC

## 2022-11-15 ENCOUNTER — Encounter: Payer: Self-pay | Admitting: Vascular Surgery

## 2022-11-15 ENCOUNTER — Ambulatory Visit: Payer: Medicare HMO | Admitting: Vascular Surgery

## 2022-11-15 VITALS — BP 140/85 | HR 72 | Temp 98.0°F | Resp 16 | Ht 70.0 in | Wt 181.0 lb

## 2022-11-15 DIAGNOSIS — I872 Venous insufficiency (chronic) (peripheral): Secondary | ICD-10-CM

## 2022-11-15 DIAGNOSIS — I8393 Asymptomatic varicose veins of bilateral lower extremities: Secondary | ICD-10-CM | POA: Diagnosis not present

## 2022-11-15 HISTORY — PX: ENDOVENOUS ABLATION SAPHENOUS VEIN W/ LASER: SUR449

## 2022-11-15 NOTE — Progress Notes (Signed)
Patient name: ROBERTSON MODGLIN MRN: [id number] DOB: 02/10/1956 Sex: male  REASON FOR VISIT: For laser ablation of the left great saphenous vein greater than 20 stabs.  HPI: AMIL STALLONE is a 67 y.o. male who underwent laser ablation of the right great saphenous vein greater than 20 stabs on 10/25/2022.  He has C4 venous disease.  He presents now for laser ablation of the left great saphenous vein greater than 20 stabs.  Current Outpatient Medications  Medication Sig Dispense Refill   carboxymethylcellulose (REFRESH PLUS) 0.5 % SOLN 2 drops 2 (two) times daily as needed (dry eyes).     lisinopril (PRINIVIL,ZESTRIL) 10 MG tablet Take 10 mg by mouth daily.     loratadine (CLARITIN) 10 MG tablet Take 10 mg by mouth daily.     LORazepam (ATIVAN) 1 MG tablet Take 1 tablet 30 minutes prior to leaving house on day of office surgery.  Bring second tablet with you to office on day of office surgery. 2 tablet 0   Multiple Vitamins-Minerals (MULTIVITAMIN WITH MINERALS) tablet Take 1 tablet by mouth daily.     Omega-3 Fatty Acids (FISH OIL) 1000 MG CAPS Take 1 capsule by mouth daily.     aspirin 81 MG tablet Take 81 mg by mouth daily.     LORazepam (ATIVAN) 1 MG tablet Take 1 tablet 30 minutes prior to leaving house on day of office surgery.  Bring second tablet with you to office on day of office surgery. 2 tablet 0   polyethylene glycol-electrolytes (NULYTELY/GOLYTELY) 420 g solution Take 4,000 mLs by mouth as directed.   0   predniSONE (DELTASONE) 10 MG tablet 6 tabs po day 1, 5 tabs po day 2, 4 tabs po day 3, 3 tabs po day 4, 2 tabs po day 5, 1 tab po day 6 21 tablet 0   zolpidem (AMBIEN) 10 MG tablet Take 10 mg by mouth at bedtime as needed for sleep (only when traveling).      No current facility-administered medications for this visit.    PHYSICAL EXAM: Vitals:   11/15/22 0828  BP: (!) 140/85  Pulse: 72  Resp: 16  Temp: 98 F (36.7 C)  TempSrc: Temporal  SpO2: 99%  Weight: 181 lb (82.1  kg)  Height: 5' 10"$  (1.778 m)    PROCEDURE: Laser ablation left great saphenous vein with greater than 20 stabs  TECHNIQUE: Patient was taken to the exam room and the dilated varicose veins in the left leg were marked with the patient standing.  There were a large number of veins in the posterior calf lateral leg and medial calf.  Patient was then placed supine.  I looked at the great saphenous vein myself with the SonoSite and I felt that I could cannulate this in the distal thigh.  Left leg was prepped and draped in usual sterile fashion.  Under ultrasound guidance, after the skin was anesthetized, I cannulated the left great saphenous vein in the distal thigh.  Micropuncture sheath was introduced over the wire.  I then advanced the J-wire to just below the saphenofemoral junction.  The 65 cm sheath was advanced over the wire and the wire and dilator removed.  The laser fiber was positioned at the end of the sheath and the sheath retracted.  I position the laser fiber 2.5 cm distal to the saphenofemoral junction.  Tumescent anesthesia was then administered circumferentially around the vein.  Next the patient was placed in Trendelenburg.  Laser glasses were placed.  Laser ablation was performed of the left great saphenous vein from 2 and half centimeters distal to the saphenofemoral junction to the distal thigh.  50 J/cm was used at 93 W.  Attention was turned to stab phlebectomies.  All the marked areas were anesthetized with tumescent anesthesia.  Using approximate 25 small stab incisions with an 11 blade the vein was "brought above the skin and then grasped with a hemostat and bluntly excised.  Pressure was held for hemostasis.  At the completion of the procedure Steri-Strips were applied.  Pressure dressing was applied.  Patient tolerated procedure well.  He will return in 1 week for follow-up duplex.  Deitra Mayo [facility] [place] [phone]

## 2022-11-15 NOTE — Progress Notes (Signed)
     Laser Ablation Procedure    Date: 11/15/2022   Nicholas Valentine DOB:01-Aug-1956  Consent signed: Yes      Surgeon: Gae Gallop MD   Procedure: Laser Ablation: left Greater Saphenous Vein  BP (!) 140/85 (BP Location: Left Arm, Patient Position: Sitting, Cuff Size: Normal)   Pulse 72   Temp 98 F (36.7 C) (Temporal)   Resp 16   Ht '5\' 10"'$  (1.778 m)   Wt 181 lb (82.1 kg)   SpO2 99%   BMI 25.97 kg/m   Tumescent Anesthesia: 550 cc 0.9% NaCl with 50 cc Lidocaine HCL 1%  and 15 cc 8.4% NaHCO3  Local Anesthesia: 11 cc Lidocaine HCL and NaHCO3 (ratio 2:1)  7 watts continuous mode     Total energy: 1758 Joules    Total time: 251 seconds Treatment Length  36 cm   Laser Fiber Ref. #   [id number]      Lot #  [id number]   Stab Phlebectomy: >20  Sites: Calf and Ankle  Patient tolerated procedure well  Notes:  All staff members wore facial masks. Mr. Dimmick took Ativan 1 mg (1 tablet) on 11-15-2022 at 7:15 AM and at 8:15 AM.    Description of Procedure:  After marking the course of the secondary varicosities, the patient was placed on the operating table in the supine position, and the left leg was prepped and draped in sterile fashion.   Local anesthetic was administered and under ultrasound guidance the saphenous vein was accessed with a micro needle and guide wire; then the mirco puncture sheath was placed.  A guide wire was inserted saphenofemoral junction , followed by a 5 french sheath.  The position of the sheath and then the laser fiber below the junction was confirmed using the ultrasound.  Tumescent anesthesia was administered along the course of the saphenous vein using ultrasound guidance. The patient was placed in Trendelenburg position and protective laser glasses were placed on patient and staff, and the laser was fired at 7 watts continuous mode for a total of 1758 joules.   For stab phlebectomies, local anesthetic was administered at the previously marked varicosities,  and tumescent anesthesia was administered around the vessels.  Greater than 20 stab wounds were made using the tip of an 11 blade. And using the vein hook, the phlebectomies were performed using a hemostat to avulse the varicosities.  Adequate hemostasis was achieved.     Steri strips were applied to the stab wounds and ABD pads and thigh high compression stockings were applied.  Ace wrap bandages were applied over the phlebectomy sites and at the top of the saphenofemoral junction. Blood loss was less than 15 cc.  Discharge instructions reviewed with patient and hardcopy of discharge instructions given to patient to take home. The patient ambulated out of the operating room having tolerated the procedure well.

## 2022-11-22 ENCOUNTER — Ambulatory Visit (INDEPENDENT_AMBULATORY_CARE_PROVIDER_SITE_OTHER): Payer: Medicare HMO | Admitting: Vascular Surgery

## 2022-11-22 ENCOUNTER — Ambulatory Visit (HOSPITAL_COMMUNITY)
Admission: RE | Admit: 2022-11-22 | Discharge: 2022-11-22 | Disposition: A | Discharge status: Home / Self Care | Payer: Medicare HMO | Source: Ambulatory Visit | Attending: Vascular Surgery | Admitting: Vascular Surgery

## 2022-11-22 ENCOUNTER — Encounter: Payer: Self-pay | Admitting: Vascular Surgery

## 2022-11-22 VITALS — BP 119/77 | HR 79 | Temp 98.3°F | Resp 18 | Ht 70.0 in | Wt 181.0 lb

## 2022-11-22 DIAGNOSIS — I83891 Varicose veins of right lower extremities with other complications: Secondary | ICD-10-CM | POA: Diagnosis not present

## 2022-11-22 DIAGNOSIS — I8393 Asymptomatic varicose veins of bilateral lower extremities: Secondary | ICD-10-CM

## 2022-11-22 DIAGNOSIS — I872 Venous insufficiency (chronic) (peripheral): Secondary | ICD-10-CM

## 2022-11-22 NOTE — Progress Notes (Signed)
Patient name: Nicholas Valentine MRN: [id number] DOB: 1956/10/02 Sex: male  REASON FOR VISIT: Follow-up after laser ablation of the left great saphenous vein greater than 20 stabs.  HPI: Nicholas Valentine is a 67 y.o. male who had undergone laser ablation of the right great saphenous vein and greater than 20 stabs on 10/25/2022.  He has C4 venous disease.  On 11/15/2022 he underwent laser ablation of the left great saphenous vein with greater than 20 stabs.  He comes in for a 1 week follow-up visit.  The patient is doing well with no specific complaints.  He had some mild bruising which has resolved.  He has been wearing his thigh-high compression stockings.  Current Outpatient Medications  Medication Sig Dispense Refill   carboxymethylcellulose (REFRESH PLUS) 0.5 % SOLN 2 drops 2 (two) times daily as needed (dry eyes).     lisinopril (PRINIVIL,ZESTRIL) 10 MG tablet Take 10 mg by mouth daily.     loratadine (CLARITIN) 10 MG tablet Take 10 mg by mouth daily.     Multiple Vitamins-Minerals (MULTIVITAMIN WITH MINERALS) tablet Take 1 tablet by mouth daily.     Omega-3 Fatty Acids (FISH OIL) 1000 MG CAPS Take 1 capsule by mouth daily.     aspirin 81 MG tablet Take 81 mg by mouth daily.     LORazepam (ATIVAN) 1 MG tablet Take 1 tablet 30 minutes prior to leaving house on day of office surgery.  Bring second tablet with you to office on day of office surgery. 2 tablet 0   LORazepam (ATIVAN) 1 MG tablet Take 1 tablet 30 minutes prior to leaving house on day of office surgery.  Bring second tablet with you to office on day of office surgery. 2 tablet 0   polyethylene glycol-electrolytes (NULYTELY/GOLYTELY) 420 g solution Take 4,000 mLs by mouth as directed.   0   predniSONE (DELTASONE) 10 MG tablet 6 tabs po day 1, 5 tabs po day 2, 4 tabs po day 3, 3 tabs po day 4, 2 tabs po day 5, 1 tab po day 6 21 tablet 0   zolpidem (AMBIEN) 10 MG tablet Take 10 mg by mouth at bedtime as needed for sleep (only when  traveling).      No current facility-administered medications for this visit.   REVIEW OF SYSTEMS: Valu.Nieves ] denotes positive finding; [  ] denotes negative finding  CARDIOVASCULAR:  [ ]$  chest pain   [ ]$  dyspnea on exertion  [ ]$  leg swelling  CONSTITUTIONAL:  [ ]$  fever   [ ]$  chills  PHYSICAL EXAM: Vitals:   11/22/22 1008  BP: 119/77  Pulse: 79  Resp: 18  Temp: 98.3 F (36.8 C)  TempSrc: Temporal  SpO2: 100%  Weight: 181 lb (82.1 kg)  Height: 5' 10"$  (1.778 m)   GENERAL: The patient is a well-nourished male, in no acute distress. The vital signs are documented above. CARDIOVASCULAR: There is a regular rate and rhythm. PULMONARY: There is good air exchange bilaterally without wheezing or rales. VASCULAR: He has no significant leg swelling. His incisions are healing nicely.  DATA:  VENOUS DUPLEX: I have independently interpreted his venous duplex scan today.  This shows no evidence of DVT in the left lower extremity.  The left great saphenous vein is successfully closed from the proximal calf to within 1.5 cm of the saphenofemoral junction.  MEDICAL ISSUES:  S/P STAGED LASER ABLATION OF BILATERAL GREAT SAPHENOUS VEINS WITH STAB PHLEBECTOMIES: Patient is doing well status post staged  laser ablation of bilateral great saphenous veins with stab phlebectomies.  He has 1 more week with his thigh-high compression stocking.  I encouraged him to continue to exercise and to elevate his legs daily.  I will see him back as needed.  Deitra Mayo [facility] [place] [phone]

## 2022-11-27 DIAGNOSIS — I700 Atherosclerosis of aorta: Secondary | ICD-10-CM | POA: Diagnosis not present

## 2022-11-27 DIAGNOSIS — Z23 Encounter for immunization: Secondary | ICD-10-CM | POA: Diagnosis not present

## 2022-11-27 DIAGNOSIS — D369 Benign neoplasm, unspecified site: Secondary | ICD-10-CM | POA: Diagnosis not present

## 2022-11-27 DIAGNOSIS — I10 Essential (primary) hypertension: Secondary | ICD-10-CM | POA: Diagnosis not present

## 2022-11-27 DIAGNOSIS — Z0000 Encounter for general adult medical examination without abnormal findings: Secondary | ICD-10-CM | POA: Diagnosis not present

## 2022-11-27 DIAGNOSIS — Z79899 Other long term (current) drug therapy: Secondary | ICD-10-CM | POA: Diagnosis not present

## 2022-11-27 DIAGNOSIS — Z125 Encounter for screening for malignant neoplasm of prostate: Secondary | ICD-10-CM | POA: Diagnosis not present

## 2022-11-27 DIAGNOSIS — E7800 Pure hypercholesterolemia, unspecified: Secondary | ICD-10-CM | POA: Diagnosis not present

## 2022-11-27 DIAGNOSIS — D696 Thrombocytopenia, unspecified: Secondary | ICD-10-CM | POA: Diagnosis not present

## 2022-12-21 DIAGNOSIS — Z9889 Other specified postprocedural states: Secondary | ICD-10-CM | POA: Diagnosis not present

## 2022-12-21 DIAGNOSIS — M5416 Radiculopathy, lumbar region: Secondary | ICD-10-CM | POA: Diagnosis not present

## 2022-12-27 ENCOUNTER — Other Ambulatory Visit: Payer: Self-pay | Admitting: Student

## 2022-12-27 DIAGNOSIS — M5416 Radiculopathy, lumbar region: Secondary | ICD-10-CM

## 2023-01-20 ENCOUNTER — Ambulatory Visit
Admission: RE | Admit: 2023-01-20 | Discharge: 2023-01-20 | Disposition: A | Discharge status: Home / Self Care | Payer: Medicare HMO | Source: Ambulatory Visit | Attending: Student | Admitting: Student

## 2023-01-20 DIAGNOSIS — M5416 Radiculopathy, lumbar region: Secondary | ICD-10-CM

## 2023-01-20 MED ORDER — GADOPICLENOL 0.5 MMOL/ML IV SOLN
9.0000 mL | Freq: Once | INTRAVENOUS | Status: AC | PRN
  Administered 2023-01-20: 9 mL via INTRAVENOUS

## 2023-01-31 DIAGNOSIS — Z6826 Body mass index (BMI) 26.0-26.9, adult: Secondary | ICD-10-CM | POA: Diagnosis not present

## 2023-01-31 DIAGNOSIS — M5416 Radiculopathy, lumbar region: Secondary | ICD-10-CM | POA: Diagnosis not present

## 2023-04-09 DIAGNOSIS — M4726 Other spondylosis with radiculopathy, lumbar region: Secondary | ICD-10-CM | POA: Diagnosis not present

## 2023-04-09 DIAGNOSIS — M48062 Spinal stenosis, lumbar region with neurogenic claudication: Secondary | ICD-10-CM | POA: Diagnosis not present

## 2023-04-09 DIAGNOSIS — M5416 Radiculopathy, lumbar region: Secondary | ICD-10-CM | POA: Diagnosis not present

## 2023-05-16 DIAGNOSIS — L821 Other seborrheic keratosis: Secondary | ICD-10-CM | POA: Diagnosis not present

## 2023-05-16 DIAGNOSIS — D225 Melanocytic nevi of trunk: Secondary | ICD-10-CM | POA: Diagnosis not present

## 2023-05-16 DIAGNOSIS — L570 Actinic keratosis: Secondary | ICD-10-CM | POA: Diagnosis not present

## 2023-05-16 DIAGNOSIS — Z872 Personal history of diseases of the skin and subcutaneous tissue: Secondary | ICD-10-CM | POA: Diagnosis not present

## 2023-05-16 DIAGNOSIS — L814 Other melanin hyperpigmentation: Secondary | ICD-10-CM | POA: Diagnosis not present

## 2023-05-28 DIAGNOSIS — H2513 Age-related nuclear cataract, bilateral: Secondary | ICD-10-CM | POA: Diagnosis not present

## 2023-05-28 DIAGNOSIS — H52203 Unspecified astigmatism, bilateral: Secondary | ICD-10-CM | POA: Diagnosis not present

## 2023-05-29 DIAGNOSIS — D369 Benign neoplasm, unspecified site: Secondary | ICD-10-CM | POA: Diagnosis not present

## 2023-05-29 DIAGNOSIS — I700 Atherosclerosis of aorta: Secondary | ICD-10-CM | POA: Diagnosis not present

## 2023-05-29 DIAGNOSIS — D696 Thrombocytopenia, unspecified: Secondary | ICD-10-CM | POA: Diagnosis not present

## 2023-05-29 DIAGNOSIS — E7800 Pure hypercholesterolemia, unspecified: Secondary | ICD-10-CM | POA: Diagnosis not present

## 2023-05-29 DIAGNOSIS — I10 Essential (primary) hypertension: Secondary | ICD-10-CM | POA: Diagnosis not present

## 2023-07-21 DIAGNOSIS — Z23 Encounter for immunization: Secondary | ICD-10-CM | POA: Diagnosis not present

## 2023-09-24 DIAGNOSIS — H524 Presbyopia: Secondary | ICD-10-CM | POA: Diagnosis not present

## 2023-10-08 DIAGNOSIS — R509 Fever, unspecified: Secondary | ICD-10-CM | POA: Diagnosis not present

## 2023-10-08 DIAGNOSIS — R39198 Other difficulties with micturition: Secondary | ICD-10-CM | POA: Diagnosis not present

## 2023-11-19 DIAGNOSIS — L570 Actinic keratosis: Secondary | ICD-10-CM | POA: Diagnosis not present

## 2023-11-19 DIAGNOSIS — Z872 Personal history of diseases of the skin and subcutaneous tissue: Secondary | ICD-10-CM | POA: Diagnosis not present

## 2023-11-19 DIAGNOSIS — L738 Other specified follicular disorders: Secondary | ICD-10-CM | POA: Diagnosis not present

## 2023-12-07 DIAGNOSIS — Z1331 Encounter for screening for depression: Secondary | ICD-10-CM | POA: Diagnosis not present

## 2023-12-07 DIAGNOSIS — I10 Essential (primary) hypertension: Secondary | ICD-10-CM | POA: Diagnosis not present

## 2023-12-07 DIAGNOSIS — D369 Benign neoplasm, unspecified site: Secondary | ICD-10-CM | POA: Diagnosis not present

## 2023-12-07 DIAGNOSIS — Z125 Encounter for screening for malignant neoplasm of prostate: Secondary | ICD-10-CM | POA: Diagnosis not present

## 2023-12-07 DIAGNOSIS — D696 Thrombocytopenia, unspecified: Secondary | ICD-10-CM | POA: Diagnosis not present

## 2023-12-07 DIAGNOSIS — M79606 Pain in leg, unspecified: Secondary | ICD-10-CM | POA: Diagnosis not present

## 2023-12-07 DIAGNOSIS — Z0000 Encounter for general adult medical examination without abnormal findings: Secondary | ICD-10-CM | POA: Diagnosis not present

## 2023-12-07 DIAGNOSIS — Z79899 Other long term (current) drug therapy: Secondary | ICD-10-CM | POA: Diagnosis not present

## 2023-12-07 DIAGNOSIS — R0789 Other chest pain: Secondary | ICD-10-CM | POA: Diagnosis not present

## 2023-12-07 DIAGNOSIS — E7800 Pure hypercholesterolemia, unspecified: Secondary | ICD-10-CM | POA: Diagnosis not present

## 2023-12-07 DIAGNOSIS — Z1211 Encounter for screening for malignant neoplasm of colon: Secondary | ICD-10-CM | POA: Diagnosis not present

## 2024-01-29 DIAGNOSIS — R972 Elevated prostate specific antigen [PSA]: Secondary | ICD-10-CM | POA: Diagnosis not present

## 2024-05-19 DIAGNOSIS — Z872 Personal history of diseases of the skin and subcutaneous tissue: Secondary | ICD-10-CM | POA: Diagnosis not present

## 2024-05-19 DIAGNOSIS — L814 Other melanin hyperpigmentation: Secondary | ICD-10-CM | POA: Diagnosis not present

## 2024-05-19 DIAGNOSIS — D492 Neoplasm of unspecified behavior of bone, soft tissue, and skin: Secondary | ICD-10-CM | POA: Diagnosis not present

## 2024-05-19 DIAGNOSIS — D0439 Carcinoma in situ of skin of other parts of face: Secondary | ICD-10-CM | POA: Diagnosis not present

## 2024-05-19 DIAGNOSIS — L821 Other seborrheic keratosis: Secondary | ICD-10-CM | POA: Diagnosis not present

## 2024-05-19 DIAGNOSIS — L570 Actinic keratosis: Secondary | ICD-10-CM | POA: Diagnosis not present

## 2024-05-19 DIAGNOSIS — D225 Melanocytic nevi of trunk: Secondary | ICD-10-CM | POA: Diagnosis not present

## 2024-05-28 DIAGNOSIS — H2513 Age-related nuclear cataract, bilateral: Secondary | ICD-10-CM | POA: Diagnosis not present

## 2024-05-28 DIAGNOSIS — H52203 Unspecified astigmatism, bilateral: Secondary | ICD-10-CM | POA: Diagnosis not present

## 2024-07-05 DIAGNOSIS — Z23 Encounter for immunization: Secondary | ICD-10-CM | POA: Diagnosis not present

## 2024-07-24 DIAGNOSIS — D0439 Carcinoma in situ of skin of other parts of face: Secondary | ICD-10-CM | POA: Diagnosis not present

## 2024-07-24 DIAGNOSIS — D492 Neoplasm of unspecified behavior of bone, soft tissue, and skin: Secondary | ICD-10-CM | POA: Diagnosis not present

## 2024-07-24 DIAGNOSIS — C4442 Squamous cell carcinoma of skin of scalp and neck: Secondary | ICD-10-CM | POA: Diagnosis not present

## 2024-07-24 DIAGNOSIS — L821 Other seborrheic keratosis: Secondary | ICD-10-CM | POA: Diagnosis not present

## 2024-08-19 DIAGNOSIS — C4442 Squamous cell carcinoma of skin of scalp and neck: Secondary | ICD-10-CM | POA: Diagnosis not present

## 2024-09-01 DIAGNOSIS — I10 Essential (primary) hypertension: Secondary | ICD-10-CM | POA: Diagnosis not present

## 2024-09-01 DIAGNOSIS — M791 Myalgia, unspecified site: Secondary | ICD-10-CM | POA: Diagnosis not present

## 2024-09-01 DIAGNOSIS — E7800 Pure hypercholesterolemia, unspecified: Secondary | ICD-10-CM | POA: Diagnosis not present
